# Patient Record
Sex: Male | Born: 1960 | Race: White | Hispanic: No | Marital: Married | Smoking: Never smoker
Health system: Southern US, Community
[De-identification: ages and names within clinical notes are randomized; demographics above are authoritative.]

## PROBLEM LIST (undated history)

## (undated) DIAGNOSIS — Z87438 Personal history of other diseases of male genital organs: Secondary | ICD-10-CM

## (undated) DIAGNOSIS — R339 Retention of urine, unspecified: Secondary | ICD-10-CM

## (undated) DIAGNOSIS — E785 Hyperlipidemia, unspecified: Secondary | ICD-10-CM

## (undated) HISTORY — DX: Retention of urine, unspecified: R33.9

## (undated) HISTORY — PX: ANTERIOR FUSION CERVICAL SPINE: SUR626

---

## 2004-07-11 ENCOUNTER — Ambulatory Visit: Payer: Self-pay

## 2004-09-17 ENCOUNTER — Ambulatory Visit (HOSPITAL_COMMUNITY): Admission: RE | Admit: 2004-09-17 | Discharge: 2004-09-18 | Payer: Self-pay | Admitting: Neurosurgery

## 2006-08-28 ENCOUNTER — Emergency Department: Payer: Self-pay | Admitting: Emergency Medicine

## 2014-03-09 ENCOUNTER — Ambulatory Visit: Payer: Self-pay | Admitting: Unknown Physician Specialty

## 2014-03-12 LAB — PATHOLOGY REPORT

## 2015-02-20 ENCOUNTER — Ambulatory Visit
Admission: RE | Admit: 2015-02-20 | Discharge: 2015-02-20 | Disposition: A | Discharge status: Home / Self Care | Payer: 59 | Source: Ambulatory Visit | Attending: Emergency Medicine | Admitting: Emergency Medicine

## 2015-02-20 ENCOUNTER — Observation Stay
Admission: EM | Admit: 2015-02-20 | Discharge: 2015-02-22 | Disposition: A | Discharge status: Home / Self Care | Payer: 59 | Attending: Surgery | Admitting: Surgery

## 2015-02-20 ENCOUNTER — Encounter
Admission: EM | Disposition: A | Discharge status: Home / Self Care | Payer: Self-pay | Source: Home / Self Care | Attending: Emergency Medicine

## 2015-02-20 ENCOUNTER — Observation Stay: Payer: 59 | Admitting: Anesthesiology

## 2015-02-20 ENCOUNTER — Other Ambulatory Visit: Payer: Self-pay | Admitting: Emergency Medicine

## 2015-02-20 ENCOUNTER — Encounter: Payer: Self-pay | Admitting: Emergency Medicine

## 2015-02-20 DIAGNOSIS — K3580 Unspecified acute appendicitis: Secondary | ICD-10-CM

## 2015-02-20 DIAGNOSIS — K3589 Other acute appendicitis: Secondary | ICD-10-CM | POA: Diagnosis not present

## 2015-02-20 DIAGNOSIS — K37 Unspecified appendicitis: Secondary | ICD-10-CM | POA: Diagnosis present

## 2015-02-20 DIAGNOSIS — K353 Acute appendicitis with localized peritonitis, without perforation or gangrene: Secondary | ICD-10-CM | POA: Insufficient documentation

## 2015-02-20 DIAGNOSIS — Z981 Arthrodesis status: Secondary | ICD-10-CM | POA: Insufficient documentation

## 2015-02-20 DIAGNOSIS — R1031 Right lower quadrant pain: Secondary | ICD-10-CM

## 2015-02-20 DIAGNOSIS — D72829 Elevated white blood cell count, unspecified: Secondary | ICD-10-CM | POA: Diagnosis not present

## 2015-02-20 HISTORY — PX: LAPAROSCOPIC APPENDECTOMY: SHX408

## 2015-02-20 LAB — URINALYSIS COMPLETE WITH MICROSCOPIC (ARMC ONLY)
Bilirubin Urine: NEGATIVE
Glucose, UA: NEGATIVE mg/dL
Hgb urine dipstick: NEGATIVE
Leukocytes, UA: NEGATIVE
Nitrite: NEGATIVE
PROTEIN: NEGATIVE mg/dL
Specific Gravity, Urine: 1.023 (ref 1.005–1.030)
Squamous Epithelial / LPF: NONE SEEN
pH: 7 (ref 5.0–8.0)

## 2015-02-20 LAB — CBC
HCT: 46.2 % (ref 40.0–52.0)
HEMOGLOBIN: 14.9 g/dL (ref 13.0–18.0)
MCH: 27.2 pg (ref 26.0–34.0)
MCHC: 32.4 g/dL (ref 32.0–36.0)
MCV: 84.1 fL (ref 80.0–100.0)
Platelets: 279 10*3/uL (ref 150–440)
RBC: 5.49 MIL/uL (ref 4.40–5.90)
RDW: 12.9 % (ref 11.5–14.5)
WBC: 27.6 10*3/uL — ABNORMAL HIGH (ref 3.8–10.6)

## 2015-02-20 LAB — LIPASE, BLOOD: Lipase: 20 U/L — ABNORMAL LOW (ref 22–51)

## 2015-02-20 LAB — COMPREHENSIVE METABOLIC PANEL
ALBUMIN: 4.5 g/dL (ref 3.5–5.0)
ALK PHOS: 72 U/L (ref 38–126)
ALT: 20 U/L (ref 17–63)
ANION GAP: 8 (ref 5–15)
AST: 24 U/L (ref 15–41)
BUN: 13 mg/dL (ref 6–20)
CALCIUM: 9.3 mg/dL (ref 8.9–10.3)
CO2: 26 mmol/L (ref 22–32)
CREATININE: 0.91 mg/dL (ref 0.61–1.24)
Chloride: 100 mmol/L — ABNORMAL LOW (ref 101–111)
GFR calc Af Amer: >60 mL/min (ref >60)
GFR calc non Af Amer: >60 mL/min (ref >60)
GLUCOSE: 130 mg/dL — AB (ref 65–99)
Potassium: 4.2 mmol/L (ref 3.5–5.1)
SODIUM: 134 mmol/L — AB (ref 135–145)
Total Bilirubin: 1.7 mg/dL — ABNORMAL HIGH (ref 0.3–1.2)
Total Protein: 7.8 g/dL (ref 6.5–8.1)

## 2015-02-20 SURGERY — APPENDECTOMY, LAPAROSCOPIC
Anesthesia: General

## 2015-02-20 MED ORDER — OXYCODONE HCL 5 MG PO TABS
5.0000 mg | ORAL_TABLET | ORAL | Status: DC | PRN
  Administered 2015-02-21 – 2015-02-22 (×3): 5 mg via ORAL
  Filled 2015-02-20: qty 2
  Filled 2015-02-20 (×2): qty 1

## 2015-02-20 MED ORDER — NEOSTIGMINE METHYLSULFATE 10 MG/10ML IV SOLN
INTRAVENOUS | Status: DC | PRN
  Administered 2015-02-20: 1 mg via INTRAVENOUS
  Administered 2015-02-20: 3 mg via INTRAVENOUS

## 2015-02-20 MED ORDER — PROPOFOL 10 MG/ML IV BOLUS
INTRAVENOUS | Status: DC | PRN
  Administered 2015-02-20: 150 mg via INTRAVENOUS

## 2015-02-20 MED ORDER — ACETAMINOPHEN 650 MG RE SUPP: 650.0000 mg | Freq: Four times a day (QID) | RECTAL | Status: DC | PRN

## 2015-02-20 MED ORDER — MORPHINE SULFATE (PF) 4 MG/ML IV SOLN
4.0000 mg | Freq: Once | INTRAVENOUS | Status: DC
  Administered 2015-02-20: 4 mg via INTRAVENOUS
  Filled 2015-02-20: qty 1

## 2015-02-20 MED ORDER — BUPIVACAINE HCL (PF) 0.25 % IJ SOLN
INTRAMUSCULAR | Status: DC | PRN
  Administered 2015-02-20: 30 mL

## 2015-02-20 MED ORDER — HYDROMORPHONE HCL 1 MG/ML IJ SOLN: 1.0000 mg | INTRAMUSCULAR | Status: DC | PRN

## 2015-02-20 MED ORDER — ACETAMINOPHEN 325 MG PO TABS: 650.0000 mg | ORAL_TABLET | Freq: Four times a day (QID) | ORAL | Status: DC | PRN

## 2015-02-20 MED ORDER — FENTANYL CITRATE (PF) 100 MCG/2ML IJ SOLN
INTRAMUSCULAR | Status: DC | PRN
  Administered 2015-02-20 (×2): 50 ug via INTRAVENOUS
  Administered 2015-02-20: 100 ug via INTRAVENOUS
  Administered 2015-02-20: 50 ug via INTRAVENOUS

## 2015-02-20 MED ORDER — LIDOCAINE HCL (CARDIAC) 20 MG/ML IV SOLN
INTRAVENOUS | Status: DC | PRN
  Administered 2015-02-20: 60 mg via INTRAVENOUS

## 2015-02-20 MED ORDER — KETOROLAC TROMETHAMINE 30 MG/ML IJ SOLN
INTRAMUSCULAR | Status: DC | PRN
  Administered 2015-02-20: 30 mg via INTRAVENOUS

## 2015-02-20 MED ORDER — FINASTERIDE 5 MG PO TABS: 5.0000 mg | ORAL_TABLET | Freq: Every day | ORAL | Status: DC

## 2015-02-20 MED ORDER — IOHEXOL 350 MG/ML SOLN
100.0000 mL | Freq: Once | INTRAVENOUS | Status: AC | PRN
  Administered 2015-02-20: 100 mL via INTRAVENOUS

## 2015-02-20 MED ORDER — ONDANSETRON HCL 4 MG/2ML IJ SOLN
INTRAMUSCULAR | Status: DC | PRN
  Administered 2015-02-20: 4 mg via INTRAVENOUS

## 2015-02-20 MED ORDER — ONDANSETRON HCL 4 MG/2ML IJ SOLN: 4.0000 mg | Freq: Four times a day (QID) | INTRAMUSCULAR | Status: DC | PRN

## 2015-02-20 MED ORDER — FENTANYL CITRATE (PF) 100 MCG/2ML IJ SOLN
25.0000 ug | INTRAMUSCULAR | Status: DC | PRN
  Administered 2015-02-20 (×4): 25 ug via INTRAVENOUS

## 2015-02-20 MED ORDER — LACTATED RINGERS IV SOLN
INTRAVENOUS | Status: DC | PRN
  Administered 2015-02-20: 16:00:00 via INTRAVENOUS

## 2015-02-20 MED ORDER — ROCURONIUM BROMIDE 100 MG/10ML IV SOLN
INTRAVENOUS | Status: DC | PRN
  Administered 2015-02-20: 10 mg via INTRAVENOUS
  Administered 2015-02-20: 25 mg via INTRAVENOUS
  Administered 2015-02-20: 5 mg via INTRAVENOUS

## 2015-02-20 MED ORDER — FENTANYL CITRATE (PF) 100 MCG/2ML IJ SOLN
INTRAMUSCULAR | Status: AC
  Administered 2015-02-20: 25 ug via INTRAVENOUS
  Filled 2015-02-20: qty 2

## 2015-02-20 MED ORDER — SODIUM CHLORIDE 0.9 % IV SOLN
INTRAVENOUS | Status: DC | PRN
  Administered 2015-02-20: 17:00:00 via INTRAMUSCULAR

## 2015-02-20 MED ORDER — KCL IN DEXTROSE-NACL 20-5-0.45 MEQ/L-%-% IV SOLN
INTRAVENOUS | Status: DC
  Administered 2015-02-20 (×2): via INTRAVENOUS
  Filled 2015-02-20 (×5): qty 1000

## 2015-02-20 MED ORDER — PANTOPRAZOLE SODIUM 40 MG IV SOLR: 40.0000 mg | Freq: Every day | INTRAVENOUS | Status: DC

## 2015-02-20 MED ORDER — ONDANSETRON HCL 4 MG/2ML IJ SOLN
4.0000 mg | Freq: Once | INTRAMUSCULAR | Status: DC
  Administered 2015-02-20: 4 mg via INTRAVENOUS
  Filled 2015-02-20: qty 2

## 2015-02-20 MED ORDER — ONDANSETRON 4 MG PO TBDP: 4.0000 mg | ORAL_TABLET | Freq: Four times a day (QID) | ORAL | Status: DC | PRN

## 2015-02-20 MED ORDER — SODIUM CHLORIDE 0.9 % IV BOLUS (SEPSIS)
1000.0000 mL | Freq: Once | INTRAVENOUS | Status: DC
  Administered 2015-02-20: 1000 mL via INTRAVENOUS

## 2015-02-20 MED ORDER — ONDANSETRON HCL 4 MG/2ML IJ SOLN: 4.0000 mg | Freq: Once | INTRAMUSCULAR | Status: DC | PRN

## 2015-02-20 MED ORDER — MIDAZOLAM HCL 5 MG/5ML IJ SOLN
INTRAMUSCULAR | Status: DC | PRN
  Administered 2015-02-20: 2 mg via INTRAVENOUS

## 2015-02-20 MED ORDER — ENOXAPARIN SODIUM 40 MG/0.4ML ~~LOC~~ SOLN
40.0000 mg | SUBCUTANEOUS | Status: DC
  Administered 2015-02-21: 40 mg via SUBCUTANEOUS
  Filled 2015-02-20: qty 0.4

## 2015-02-20 MED ORDER — DEXAMETHASONE SODIUM PHOSPHATE 10 MG/ML IJ SOLN
INTRAMUSCULAR | Status: DC | PRN
  Administered 2015-02-20: 10 mg via INTRAVENOUS

## 2015-02-20 MED ORDER — PIPERACILLIN-TAZOBACTAM 3.375 G IVPB
INTRAVENOUS | Status: AC
  Filled 2015-02-20: qty 50

## 2015-02-20 MED ORDER — SODIUM CHLORIDE 0.9 % IR SOLN
Status: DC | PRN
  Administered 2015-02-20: 1000 mL

## 2015-02-20 MED ORDER — SUCCINYLCHOLINE CHLORIDE 20 MG/ML IJ SOLN
INTRAMUSCULAR | Status: DC | PRN
  Administered 2015-02-20: 120 mg via INTRAVENOUS

## 2015-02-20 MED ORDER — PIPERACILLIN-TAZOBACTAM 3.375 G IVPB 30 MIN
3.3750 g | Freq: Once | INTRAVENOUS | Status: AC
  Administered 2015-02-20: 3.375 g via INTRAVENOUS

## 2015-02-20 MED ORDER — GLYCOPYRROLATE 0.2 MG/ML IJ SOLN
INTRAMUSCULAR | Status: DC | PRN
  Administered 2015-02-20: .5 mg via INTRAVENOUS
  Administered 2015-02-20: .1 mg via INTRAVENOUS

## 2015-02-20 MED ORDER — PIPERACILLIN-TAZOBACTAM 3.375 G IVPB
3.3750 g | Freq: Three times a day (TID) | INTRAVENOUS | Status: DC
  Administered 2015-02-20 – 2015-02-22 (×5): 3.375 g via INTRAVENOUS
  Filled 2015-02-20 (×9): qty 50

## 2015-02-20 SURGICAL SUPPLY — 40 items
BLADE CLIPPER SURG (BLADE) ×2 IMPLANT
CANISTER SUCT 1200ML W/VALVE (MISCELLANEOUS) ×2 IMPLANT
CANISTER SUCT 3000ML (MISCELLANEOUS) ×3 IMPLANT
CHLORAPREP W/TINT 26ML (MISCELLANEOUS) ×3 IMPLANT
CUTTER LINEAR ENDO 35 ART THIN (STAPLE) ×3 IMPLANT
DECANTER SPIKE VIAL GLASS SM (MISCELLANEOUS) ×2 IMPLANT
DRSG TEGADERM 2-3/8X2-3/4 SM (GAUZE/BANDAGES/DRESSINGS) ×9 IMPLANT
DRSG TELFA 3X8 NADH (GAUZE/BANDAGES/DRESSINGS) ×3 IMPLANT
GLOVE BIO SURGEON STRL SZ7.5 (GLOVE) ×7 IMPLANT
GLOVE INDICATOR 8.0 STRL GRN (GLOVE) ×3 IMPLANT
GOWN STRL REUS W/ TWL LRG LVL3 (GOWN DISPOSABLE) ×2 IMPLANT
GOWN STRL REUS W/TWL LRG LVL3 (GOWN DISPOSABLE) ×6
GRASPER SUT TROCAR 14GX15 (MISCELLANEOUS) ×3 IMPLANT
IRRIGATION STRYKERFLOW (MISCELLANEOUS) ×1 IMPLANT
IRRIGATOR STRYKERFLOW (MISCELLANEOUS) ×3
IV NS 1000ML (IV SOLUTION) ×6
IV NS 1000ML BAXH (IV SOLUTION) ×2 IMPLANT
KIT RM TURNOVER STRD PROC AR (KITS) ×3 IMPLANT
NDL FILTER BLUNT 18X1 1/2 (NEEDLE) ×1 IMPLANT
NDL HYPO 25X1 1.5 SAFETY (NEEDLE) ×1 IMPLANT
NDL INSUFFLATION 14GA 120MM (NEEDLE) ×1 IMPLANT
NEEDLE FILTER BLUNT 18X 1/2SAF (NEEDLE) ×2
NEEDLE FILTER BLUNT 18X1 1/2 (NEEDLE) ×1 IMPLANT
NEEDLE HYPO 25X1 1.5 SAFETY (NEEDLE) ×3 IMPLANT
NEEDLE INSUFFLATION 14GA 120MM (NEEDLE) ×3 IMPLANT
NS IRRIG 500ML POUR BTL (IV SOLUTION) ×3 IMPLANT
PACK LAP CHOLECYSTECTOMY (MISCELLANEOUS) ×3 IMPLANT
PAD DRESSING TELFA 3X8 NADH (GAUZE/BANDAGES/DRESSINGS) ×1 IMPLANT
PAD GROUND ADULT SPLIT (MISCELLANEOUS) ×3 IMPLANT
POUCH ENDO CATCH 10MM SPEC (MISCELLANEOUS) ×3 IMPLANT
RELOAD CUTTER ETS 35MM STAND (ENDOMECHANICALS) ×11 IMPLANT
SCISSORS METZENBAUM CVD 33 (INSTRUMENTS) ×3 IMPLANT
SUT ETHILON 5-0 FS-2 18 BLK (SUTURE) ×6 IMPLANT
SUT VIC AB 0 CT2 27 (SUTURE) ×3 IMPLANT
SYRINGE 10CC LL (SYRINGE) ×3 IMPLANT
TROCAR XCEL 12X100 BLDLESS (ENDOMECHANICALS) ×5 IMPLANT
TROCAR Z-THREAD FIOS 11X100 BL (TROCAR) ×3 IMPLANT
TROCAR Z-THREAD OPTICAL 5X100M (TROCAR) ×2 IMPLANT
TROCAR Z-THREAD SLEEVE 11X100 (TROCAR) ×3 IMPLANT
TUBING INSUFFLATOR HI FLOW (MISCELLANEOUS) ×3 IMPLANT

## 2015-02-20 NOTE — ED Notes (Signed)
Patient to ED from CT due to acute appendicitis. Patient reports RLQ abdominal pain for the last 24 hours.

## 2015-02-20 NOTE — Transfer of Care (Signed)
  Immediate Anesthesia Transfer of Care Note  Patient: Eric Anderson  Procedure(s) Performed: Procedure(s): APPENDECTOMY LAPAROSCOPIC (N/A)  Patient Location: PACU  Anesthesia Type:General  Level of Consciousness: awake and patient cooperative  Airway & Oxygen Therapy: Patient Spontanous Breathing and Patient connected to face mask oxygen  Post-op Assessment: Report given to RN and Post -op Vital signs reviewed and stable  Post vital signs: Reviewed and stable  Last Vitals:  Filed Vitals:   02/20/15 1807  BP: 139/68  Pulse: 81  Temp: 37.7 C  Resp: 10    Complications: No apparent anesthesia complications

## 2015-02-20 NOTE — ED Notes (Signed)
Patients keeps in his possession: watch, glasses, cellphone, shirt, shorts, shoes, and wallet

## 2015-02-20 NOTE — Anesthesia Preprocedure Evaluation (Addendum)
Anesthesia Evaluation  Patient identified by MRN, date of birth, ID band Patient awake    Reviewed: Allergy & Precautions, NPO status , Patient's Chart, lab work & pertinent test results  Airway Mallampati: II  TM Distance: <3 FB Neck ROM: Full    Dental  (+) Chipped   Pulmonary neg pulmonary ROS,    Pulmonary exam normal breath sounds clear to auscultation       Cardiovascular negative cardio ROS Normal cardiovascular exam     Neuro/Psych negative neurological ROS  negative psych ROS   GI/Hepatic negative GI ROS, Neg liver ROS,   Endo/Other  negative endocrine ROS  Renal/GU negative Renal ROS  negative genitourinary   Musculoskeletal negative musculoskeletal ROS (+)   Abdominal Normal abdominal exam  (+)   Peds negative pediatric ROS (+)  Hematology negative hematology ROS (+)   Anesthesia Other Findings   Reproductive/Obstetrics                            Anesthesia Physical Anesthesia Plan  ASA: II  Anesthesia Plan: General   Post-op Pain Management:    Induction: Intravenous  Airway Management Planned: Oral ETT  Additional Equipment:   Intra-op Plan:   Post-operative Plan: Extubation in OR  Informed Consent: I have reviewed the patients History and Physical, chart, labs and discussed the procedure including the risks, benefits and alternatives for the proposed anesthesia with the patient or authorized representative who has indicated his/her understanding and acceptance.   Dental advisory given  Plan Discussed with: CRNA and Surgeon  Anesthesia Plan Comments:         Anesthesia Quick Evaluation

## 2015-02-20 NOTE — Anesthesia Procedure Notes (Signed)
Procedure Name: Intubation Date/Time: 02/20/2015 4:45 PM Performed by: Lily Kocher Pre-anesthesia Checklist: Patient identified, Patient being monitored, Timeout performed, Emergency Drugs available and Suction available Patient Re-evaluated:Patient Re-evaluated prior to inductionOxygen Delivery Method: Circle system utilized Preoxygenation: Pre-oxygenation with 100% oxygen Intubation Type: IV induction Ventilation: Mask ventilation without difficulty Laryngoscope Size: Mac and 4 Grade View: Grade II Tube type: Oral Tube size: 7.5 mm Number of attempts: 1 (pt anterior) Airway Equipment and Method: Stylet Placement Confirmation: ETT inserted through vocal cords under direct vision,  positive ETCO2 and breath sounds checked- equal and bilateral Secured at: 23 cm Dental Injury: Teeth and Oropharynx as per pre-operative assessment

## 2015-02-20 NOTE — H&P (Signed)
Eric Anderson is a 54 y.o. male  24 hours of abdominal pain.  HPI: He was in his usual state of good health until yesterday approximately lunchtime when he began to develop some vague generalized abdominal discomfort and malaise. It progressed over the course of the evening to significant abdominal pain and he presented to his primary care physician for further evaluation. He was sent to the hospital for CT scan which demonstrated what appeared to be acute appendicitis with inflammatory stranding around the appendix. No other abnormality was identified.  A month ago the patient has similar episode which resolved over 24 hours. He describes the pain is almost identical to the current situation but did not progress. He's been nauseated and vomited once during this evaluation. He denies any other significant abdominal problems. Specifically he has no history of hepatitis yellow jaundice pancreatitis peptic ulcer disease gallbladder disease or diverticulitis. He's had no previous abdominal surgery. He does have a history of cervical fusion. He had a colonoscopy several years ago which demonstrated multiple polyps but no other evidence for significant colonic lesion. His medical history is otherwise unremarkable.  History reviewed. No pertinent past medical history. History reviewed. No pertinent past surgical history. Social History   Social History  . Marital Status: Married    Spouse Name: N/A  . Number of Children: N/A  . Years of Education: N/A   Social History Main Topics  . Smoking status: Never Smoker   . Smokeless tobacco: None  . Alcohol Use: Yes     Comment: occasional  . Drug Use: None  . Sexual Activity: Not Asked   Other Topics Concern  . None   Social History Narrative     Review of Systems  Constitutional: Negative.   HENT: Negative.   Eyes: Negative.   Respiratory: Negative.   Cardiovascular: Negative.   Gastrointestinal: Positive for nausea, vomiting and  abdominal pain. Negative for heartburn, diarrhea and constipation.  Genitourinary: Negative.   Musculoskeletal: Negative.   Skin: Negative.   Neurological: Negative.   Psychiatric/Behavioral: Negative.      PHYSICAL EXAM: BP 124/81 mmHg  Pulse 94  Temp(Src) 99 F (37.2 C) (Oral)  Resp 12  Ht 5\' 8"  (1.727 m)  Wt 85.276 kg (188 lb)  BMI 28.59 kg/m2  SpO2 96%  Physical Exam  Constitutional: He is oriented to person, place, and time. He appears well-developed and well-nourished.  HENT:  Head: Normocephalic and atraumatic.  Eyes: EOM are normal. Pupils are equal, round, and reactive to light.  Neck: Normal range of motion. Neck supple.  Cardiovascular: Regular rhythm and normal heart sounds.   Pulmonary/Chest: Effort normal and breath sounds normal.  Abdominal: Soft. Bowel sounds are normal. He exhibits no distension. There is tenderness. There is rebound and guarding.  Musculoskeletal: Normal range of motion. He exhibits no edema or tenderness.  Neurological: He is alert and oriented to person, place, and time.  Skin: Skin is warm and dry.  Psychiatric: His behavior is normal. Judgment normal.   His abdomen is markedly tender primarily in right lower quadrant suprapubic area. He is guarding in that area with moderate rebound. He has referred rebound to the right lower quadrant.  Impression/Plan: I independently reviewed his CT scan. His white blood cell count is 27,000. In this setting we would be concerned about possibility of appendicitis possibly even a ruptured appendicitis. We discussed risks benefits and options of surgical intervention. We also discussed primary antibiotic therapy. In this setting with a markedly elevated  white blood cell count CT evidence for appendicitis otherwise healthy individual I would recommend surgery and he is in agreement. We will plan for surgical intervention citizen operating room can be arranged.   Tiney Rouge III, MD  02/20/2015, 1:16 PM

## 2015-02-20 NOTE — Op Note (Signed)
02/20/2015  5:58 PM  PATIENT:  Eric Anderson  54 y.o. male  PRE-OPERATIVE DIAGNOSIS:  appendicitis  POST-OPERATIVE DIAGNOSIS:  appendicitis  PROCEDURE:  Procedure(s): APPENDECTOMY LAPAROSCOPIC (N/A)  SURGEON:  Surgeon(s) and Role:    * Tiney Rouge III, MD - Primary   ASSISTANTS: none   ANESTHESIA:   general  EBL:  Total I/O In: 600 [I.V.:600] Out: 100 [Blood:100]   DRAINS: none   LOCAL MEDICATIONS USED:  BUPIVICAINE    DISPOSITION OF SPECIMEN:  PATHOLOGY   DICTATION: .Dragon Dictation with the patient supine position after induction of appropriate general anesthesia patient abdomen was prepped ChloraPrep and draped sterile towels. The patient was placed in headdown feet up position. Small infraumbilical incision was made in standard fashion carried down bluntly to subcutaneous tissue. A Varess needle was used to Cannulate peritoneal cavity. CO2 was insufflated to appropriate pressure measurements. When approximately 2 and half liters of CO2 were instilled a Varess needle was withdrawn and an 11 mm port placed under direct vision. Intraperitoneal position was confirmed and CO2 was reinsufflated. The patient was rolled slightly to the left side. A midepigastric transverse incision was made and a level millimeter port inserted under direct vision. There was significant amount of purulent fluid in the right lower quadrant. The appendix was foreshortened as was the mesentery and appeared to be markedly inflamed. I did not see any evidence of rupture.  Suprapubic transverse incision was made and a 12 mm port inserted under direct vision. The camera was moved to the upper port and dissection was carried up to the 2 lower ports. The patient appendix was identified a window created in the mesial appendix and the base divided using a single application of the Endo GIA stapler carrying a blue load. The mesoappendix was then divided with multiple applications of the Endo GIA stapler carrying  a white load. Appendix was captured in an Endo Catch apparatus removed through the suprapubic incision. The area was then clips irrigated with warm saline solution. A lower midline incision was closed with figure-of-eight suture of 0 Vicryl using the suture passer. The abdomen was then desufflated. Skin incisions were closed with 5-0 nylon. The area was infiltrated with 0.25% Marcaine for postoperative pain control. Wounds were dressed sterilely and the patient returned recovery room having tolerated the procedure well. Sponge instrument needle count were correct 2 in the operative.  PLAN OF CARE: Admit to inpatient   PATIENT DISPOSITION:  PACU - hemodynamically stable.   Tiney Rouge III, MD

## 2015-02-20 NOTE — Op Note (Deleted)
02/20/2015  2:42 PM  PATIENT:  Eric Anderson  54 y.o. male  PRE-OPERATIVE DIAGNOSIS:  side  POST-OPERATIVE DIAGNOSIS:  * No post-op diagnosis entered *  PROCEDURE:  Procedure(s): APPENDECTOMY LAPAROSCOPIC (N/A)  SURGEON:  Surgeon(s) and Role:    * Tiney Rouge III, MD - Primary   ASSISTANTS: none   ANESTHESIA:   general  EBL:      DRAINS: (1) Jackson-Pratt drain(s) with closed bulb suction in the Gallbladder fossa   LOCAL MEDICATIONS USED:  BUPIVICAINE    DISPOSITION OF SPECIMEN:  PATHOLOGY   DICTATION: .Dragon Dictation with the patient in the supine position infection appropriate general anesthesia the patient's abdomen was prepped with ChloraPrep and draped sterile towels. Patient's place the headdown feet up position. Small infraumbilical incision was made standard fashion carried down bluntly to subcutaneous tissue. There is needle was used to cannulate peritoneal cavity. CO2 was insufflated to appropriate pressure measurements. When approximately 2 L of CO2 were instilled a varies needle was withdrawn and an 11 mm port inserted in the peritoneal cavity. Into the position was confirmed and CO2 was reinsufflated.  The patient was placed head up feet down position rotated slightly to the left side. Subxiphoid transverse incision was made 11 mm port placed under direct vision. 2 lateral ports 5 mm in size were placed under direct vision. 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 It was aspirated approximately 50 cc of bile. The gallbladder was then retracted superiorly and laterally exposing the hepatoduodenal ligament. Cystic artery and cystic duct were identified. Both were clipped and divided. The gallbladder was then dissected free from its bed in the liver using cautery apparatus. Once it was free was captured in an Endo Catch apparatus removed through the subxiphoid incision. The area was then copiously irrigated with warm saline solution  A  19 [place] Blake drain was inserted through the epigastric incision brought out through a lateral stab wound. It was secured with 2-0 nylon. Was placed in the bed of the liver. The upper midline incision was closed with figure-of-eight suture of 0 Vicryl using the suture passer. The abdomen was then desufflated and all ports withdrawn without difficulty Skin incisions were all closed with 5-0 nylon. The area was infiltrated with 0.25% Marcaine for postoperative pain control. Sterile dressings were applied. The patient was then returned recovery room having tolerated the procedure well. Sponge instrument needle count were correct 2 in the operating room. PLAN OF CARE: Admit for overnight observation  PATIENT DISPOSITION:  PACU - hemodynamically stable.   Tiney Rouge III, MD

## 2015-02-20 NOTE — ED Provider Notes (Signed)
[facility] Emergency Department Provider Note  Time seen: 1:10 PM  I have reviewed the triage vital signs and the nursing notes.   HISTORY  Chief Complaint Abdominal Pain    HPI Eric Anderson is a 54 y.o. male with no past medical history who presents the emergency department with lower abdominal pain since yesterday. According to the patient he developed lower abdominal pain and some nausea yesterday. It worsened overnight and into this morning. He went to an urgent care who sent him to the emergency department for CT scan. Following his CT scan he was taken directly to the emergency department due to positive acute appendicitis on CT scan. Patient describes as lower abdominal pain as a 6/10 when lying still which increases to a 10/10 with any movement. Feels nauseated but denies any vomiting. Denies any fever.     History reviewed. No pertinent past medical history.  There are no active problems to display for this patient.   History reviewed. No pertinent past surgical history.  No current outpatient prescriptions on file.  Allergies Review of patient's allergies indicates no known allergies.  History reviewed. No pertinent family history.  Social History Social History  Substance Use Topics  . Smoking status: Never Smoker   . Smokeless tobacco: None  . Alcohol Use: Yes     Comment: occasional    Review of Systems Constitutional: Negative for fever. Cardiovascular: Negative for chest pain. Respiratory: Negative for shortness of breath. Gastrointestinal: As it for lower abdominal pain, right greater than left. Genitourinary: Negative for dysuria. Musculoskeletal: Negative for back pain. 10-point ROS otherwise negative.  ____________________________________________   PHYSICAL EXAM:  VITAL SIGNS: ED Triage Vitals  Enc Vitals Group     BP 02/20/15 1229 132/77 mmHg     Pulse Rate 02/20/15 1229 95     Resp 02/20/15 1229 18   Temp 02/20/15 1229 99 F (37.2 C)     Temp Source 02/20/15 1229 Oral     SpO2 02/20/15 1229 100 %     Weight 02/20/15 1229 188 lb (85.276 kg)     Height 02/20/15 1229  (1.727 m)     Head Cir --      Peak Flow --      Pain Score 02/20/15 1229 5     Pain Loc --      Pain Edu? --      Excl. in GC? --     Constitutional: Alert and oriented. Well appearing and in no distress. Eyes: Normal exam ENT   Mouth/Throat: Mucous membranes are moist. Cardiovascular: Normal rate, regular rhythm.  Respiratory: Normal respiratory effort without tachypnea nor retractions. Breath sounds are clear  Gastrointestinal: Soft, moderate lower abdominal tenderness, more so in the right lower quadrant and left lower quadrant. Mild voluntary guarding in the right lower quadrant, no rebound. No signs of generalized peritonitis. Musculoskeletal: Nontender with normal range of motion in all extremities.  Neurologic:  Normal speech and language. No gross focal neurologic deficits  Psychiatric: Mood and affect are normal. Speech and behavior are normal. ____________________________________________     RADIOLOGY  Acute appendicitis  ____________________________________________   INITIAL IMPRESSION / ASSESSMENT AND PLAN / ED COURSE  Pertinent labs & imaging results that were available during my care of the patient were reviewed by me and considered in my medical decision making (see chart for details).  Patient presents the emergency department with lower abdominal pain and a CT scan consistent with acute appendicitis. I discussed  the patient with Dr. Michela Pitcher, who will be down to see the patient. Patient currently describes his pain as a 5/10, worsened to a 10/10 with any movement. Patient does have some voluntary guarding in the right lower quadrant, no signs of generalized peritonitis. Patient has no chronic medical problems, denies any allergies. He has had a prior cervical fusion without any adverse  anesthesia reaction.  White blood cell count of 27, labs otherwise within normal limits. Patient taken to the operating room by Dr. Michela Pitcher.  ____________________________________________   FINAL CLINICAL IMPRESSION(S) / ED DIAGNOSES  Acute appendicitis   Minna Antis, MD 02/20/15 2059

## 2015-02-21 ENCOUNTER — Encounter: Payer: Self-pay | Admitting: Surgery

## 2015-02-21 LAB — COMPREHENSIVE METABOLIC PANEL
ALK PHOS: 59 U/L (ref 38–126)
ALT: 16 U/L — AB (ref 17–63)
ANION GAP: 7 (ref 5–15)
AST: 22 U/L (ref 15–41)
Albumin: 3.4 g/dL — ABNORMAL LOW (ref 3.5–5.0)
BUN: 14 mg/dL (ref 6–20)
CHLORIDE: 105 mmol/L (ref 101–111)
CO2: 25 mmol/L (ref 22–32)
Calcium: 8.2 mg/dL — ABNORMAL LOW (ref 8.9–10.3)
Creatinine, Ser: 0.84 mg/dL (ref 0.61–1.24)
GLUCOSE: 162 mg/dL — AB (ref 65–99)
POTASSIUM: 4 mmol/L (ref 3.5–5.1)
Sodium: 137 mmol/L (ref 135–145)
Total Bilirubin: 1.2 mg/dL (ref 0.3–1.2)
Total Protein: 6.6 g/dL (ref 6.5–8.1)

## 2015-02-21 LAB — CBC
HEMATOCRIT: 37 % — AB (ref 40.0–52.0)
HEMOGLOBIN: 12.1 g/dL — AB (ref 13.0–18.0)
MCH: 27.5 pg (ref 26.0–34.0)
MCHC: 32.6 g/dL (ref 32.0–36.0)
MCV: 84.2 fL (ref 80.0–100.0)
Platelets: 245 10*3/uL (ref 150–440)
RBC: 4.4 MIL/uL (ref 4.40–5.90)
RDW: 12.8 % (ref 11.5–14.5)
WBC: 22.4 10*3/uL — ABNORMAL HIGH (ref 3.8–10.6)

## 2015-02-21 MED ORDER — KCL IN DEXTROSE-NACL 20-5-0.45 MEQ/L-%-% IV SOLN
INTRAVENOUS | Status: DC
  Administered 2015-02-21 – 2015-02-22 (×2): via INTRAVENOUS
  Filled 2015-02-21 (×4): qty 1000

## 2015-02-21 MED ORDER — KCL IN DEXTROSE-NACL 20-5-0.45 MEQ/L-%-% IV SOLN: INTRAVENOUS | Status: DC

## 2015-02-21 NOTE — Progress Notes (Signed)
Pt post lap appendectomy day 1. No c/o pain. No n/v noted.  Ambulated around nurses' station 6 laps without difficulty independently. Continues on IV antibiotics. Encouraged ICS use. Tolerating diet. Advanced diet to full liquid as ordered.  Will cont. To monitor.

## 2015-02-21 NOTE — Progress Notes (Signed)
1 Day Post-Op   He is improving. He has less abdominal discomfort. He does complain of some incisional pain. He had a low-grade fever overnight. He does not have any significant nausea.   Vital signs in last 24 hours: Temp:  [97.5 F (36.4 C)-100.1 F (37.8 C)] 98 F (36.7 C) (09/15 1022) Pulse Rate:  [56-123] 74 (09/15 1022) Resp:  [10-24] 16 (09/15 0542) BP: (100-146)/(59-81) 130/67 mmHg (09/15 1022) SpO2:  [94 %-100 %] 96 % (09/15 1022) Weight:  [85.276 kg (188 lb)] 85.276 kg (188 lb) (09/14 1229)    Intake/Output from previous day: 09/14 0701 - 09/15 0700 In: 1449 [I.V.:1418; IV Piggyback:31] Out: 1075 [Urine:975; Blood:100]  GI: His abdomen is soft with some abdominal wall tenderness mostly incisional. He does have some drainage on the dressings.  Lab Results:  CBC  Recent Labs  02/20/15 1236 02/21/15 0711  WBC 27.6* 22.4*  HGB 14.9 12.1*  HCT 46.2 37.0*  PLT 279 245   CMP     Component Value Date/Time   NA 137 02/21/2015 0711   K 4.0 02/21/2015 0711   CL 105 02/21/2015 0711   CO2 25 02/21/2015 0711   GLUCOSE 162* 02/21/2015 0711   BUN 14 02/21/2015 0711   CREATININE 0.84 02/21/2015 0711   CALCIUM 8.2* 02/21/2015 0711   PROT 6.6 02/21/2015 0711   ALBUMIN 3.4* 02/21/2015 0711   AST 22 02/21/2015 0711   ALT 16* 02/21/2015 0711   ALKPHOS 59 02/21/2015 0711   BILITOT 1.2 02/21/2015 0711   GFRNONAA >60 02/21/2015 0711   GFRAA >60 02/21/2015 0711   PT/INR No results for input(s): LABPROT, INR in the last 72 hours.  Studies/Results: Ct Abdomen Pelvis W Contrast  02/20/2015   CLINICAL DATA:  Right lower quadrant pain starting yesterday.  EXAM: CT ABDOMEN AND PELVIS WITH CONTRAST  TECHNIQUE: Multidetector CT imaging of the abdomen and pelvis was performed using the standard protocol following bolus administration of intravenous contrast.  CONTRAST:  OMNIPAQUE IOHEXOL 350 MG/ML SOLN  COMPARISON:  None.  FINDINGS: The appendix is enlarged with surrounding  inflammation and fluid. There is no free air. There is no small bowel obstruction or diverticulitis.  The liver, spleen, pancreas, gallbladder, adrenal glands and right kidney are normal. There are small cysts within the left kidney. There is no hydronephrosis bilaterally. There is minimal atherosclerosis of the aorta. There is no abdominal lymphadenopathy.  Partial fluid-filled bladder is normal. Prostate calcifications are noted. The lung bases are clear. No acute abnormalities identified in the visualized bones.  IMPRESSION: Acute appendicitis.  These results will be called to the ordering clinician or representative by the Radiologist Assistant, and communication documented in the PACS or zVision Dashboard.   Electronically Signed   By: Sherian Rein M.D.   On: 02/20/2015 12:08    Assessment/Plan: He is overall improved. We will continue our current plan on antibiotics since he has such significant contamination at surgery. I anticipate discharge tomorrow on by mouth pain medicines and oral antibiotics. He is in agreement.

## 2015-02-22 LAB — SURGICAL PATHOLOGY

## 2015-02-22 MED ORDER — HYDROCODONE-ACETAMINOPHEN 5-325 MG PO TABS: 1.0000 | ORAL_TABLET | Freq: Four times a day (QID) | ORAL | Status: DC | PRN

## 2015-02-22 MED ORDER — OXYCODONE HCL 5 MG PO TABS: 5.0000 mg | ORAL_TABLET | ORAL | Status: DC | PRN

## 2015-02-22 MED ORDER — METRONIDAZOLE 500 MG PO TABS: 500.0000 mg | ORAL_TABLET | Freq: Three times a day (TID) | ORAL | Status: DC

## 2015-02-22 MED ORDER — CIPROFLOXACIN HCL 500 MG PO TABS: 500.0000 mg | ORAL_TABLET | Freq: Two times a day (BID) | ORAL | Status: DC

## 2015-02-22 NOTE — Progress Notes (Signed)
A&O. VSS. Tolerating diet well. medicated for pain with relief noted. No nausea reported. Ambulating unassisted with steady gait. Dressings dry and intact. IV removed per policy. Discharged per MD orders.  Discharge instructions reviewed with pt and pt verbalized understanding. Prescriptions given to pt. Awaiting ride to arrive.

## 2015-02-22 NOTE — Discharge Instructions (Signed)
Laparoscopic Appendectomy  Care After    These instructions give you information on caring for yourself after your procedure. Your doctor may also give you more specific instructions. Call your doctor if you have any problems or questions after your procedure.  HOME CARE  Do not drive while taking pain medicine (narcotics).  Take medicine (stool softener) if you cannot poop (constipated).  Change your bandages (dressings) as told by your doctor.  Keep your wounds clean and dry. Wash the wounds gently with soap and water. Gently pat the wounds dry with a clean towel.  Do not take baths, swim, or use hot tubs for 10 days, or as told by your doctor.  Only take medicine as told by your doctor.  Continue your normal diet as told by your doctor.  Do not lift more than 10 pounds (4.5 kilograms) or play contact sports for 3 weeks, or as told by your doctor.  Slowly increase your activity.  Take deep breaths to avoid a lung infection (pneumonia). GET HELP RIGHT AWAY IF:  You have a fever >101 You have a rash.  You have trouble breathing or sharp chest pain.  You have a reaction to the medicine you are taking.  Your wound is red, puffy (swollen), or painful.  You have yellowish-white fluid (pus) coming from the wound.  You have fluid coming from the wound for longer than 1 day.  You notice a bad smell coming from the wound or bandage.  Your wound breaks open after stitches (sutures) or staples are removed.  You have pain in the shoulders or shoulder blades.   You are short of breath.  You feel sick to your stomach (nauseous) or throw up (vomit).  MAKE SURE YOU:  Understand these instructions.  Will watch your condition.  Will get help right away if you are not doing well or get worse.  Appendicitis Appendicitis is when the appendix is swollen (inflamed). The inflammation can lead to developing a hole (perforation) and a collection of pus (abscess). CAUSES  There is not always an obvious  cause of appendicitis. Sometimes it is caused by an obstruction in the appendix. The obstruction can be caused by: A small, hard, pea-sized ball of stool (fecalith). Enlarged lymph glands in the appendix. SYMPTOMS  Pain around your belly button (navel) that moves toward your lower right belly (abdomen). The pain can become more severe and sharp as time passes. Tenderness in the lower right abdomen. Pain gets worse if you cough or make a sudden movement. Feeling sick to your stomach (nauseous). Throwing up (vomiting). Loss of appetite. Fever. Constipation. Diarrhea. Generally not feeling well. DIAGNOSIS  Physical exam. Blood tests. Urine test. X-rays or a CT scan may confirm the diagnosis. TREATMENT  Once the diagnosis of appendicitis is made, the most common treatment is to remove the appendix as soon as possible. This procedure is called appendectomy. In an open appendectomy, a cut (incision) is made in the lower right abdomen and the appendix is removed. In a laparoscopic appendectomy, usually 3 small incisions are made. Long, thin instruments and a camera tube are used to remove the appendix. Most patients go home in 24 to 48 hours after appendectomy. In some situations, the appendix may have already perforated and an abscess may have formed. The abscess may have a "wall" around it as seen on a CT scan. In this case, a drain may be placed into the abscess to remove fluid, and you may be treated with antibiotic medicines  that kill germs. The medicine is given through a tube in your vein (IV). Once the abscess has resolved, it may or may not be necessary to have an appendectomy. You may need to stay in the hospital longer than 48 hours. Document Released: 05/25/2005 Document Revised: 11/24/2011 Document Reviewed: 08/20/2009 [facility] Patient Information 2015 [place], [place]. This information is not intended to replace advice given to you by your health care provider. Make sure you discuss any  questions you have with your health care provider.

## 2015-02-22 NOTE — Discharge Summary (Signed)
Patient ID: Eric Anderson MRN: [id number] DOB/AGE: 1960/09/09 54 y.o.  Admit date: 02/20/2015 Discharge date: 02/22/2015  Discharge Diagnoses:  Acute suppurative appendicitis  Procedures Performed: Laparoscopic appendectomy  Discharged Condition: good  Hospital Course: Patient was seen in emergency room with signs and symptoms consistent with acute appendicitis. He was taken urgently to surgery that evening where he underwent laparoscopic appendectomy. At surgery was noted to have significant inflammatory change or purulence without evidence of an obvious rupture. His slow return of bowel function and was treated with IV antibiotics for 48 hours. To be discharged home today on by mouth antibiotics with a plan to follow him up in 1 week's time.  Discharge Orders: Discharge Instructions    Diet - low sodium heart healthy    Complete by:  As directed      Increase activity slowly    Complete by:  As directed      No wound care    Complete by:  As directed            Disposition:   Discharge Medications:  Current facility-administered medications:  .  acetaminophen (TYLENOL) tablet 650 mg, 650 mg, Oral, Q6H PRN **OR** acetaminophen (TYLENOL) suppository 650 mg, 650 mg, Rectal, Q6H PRN, Tiney Rouge III, MD .  dextrose 5 % and 0.45 % NaCl with KCl 20 mEq/L infusion, , Intravenous, Continuous, Tiney Rouge III, MD, Last Rate: 75 mL/hr at 02/22/15 0315 .  enoxaparin (LOVENOX) injection 40 mg, 40 mg, Subcutaneous, Q24H, Tiney Rouge III, MD, 40 mg at 02/21/15 1358 .  HYDROmorphone (DILAUDID) injection 1 mg, 1 mg, Intravenous, Q2H PRN, Tiney Rouge III, MD .  ondansetron (ZOFRAN-ODT) disintegrating tablet 4 mg, 4 mg, Oral, Q6H PRN **OR** ondansetron (ZOFRAN) injection 4 mg, 4 mg, Intravenous, Q6H PRN, Tiney Rouge III, MD .  oxyCODONE (Oxy IR/ROXICODONE) immediate release tablet 5-10 mg, 5-10 mg, Oral, Q4H PRN, Tiney Rouge III, MD, 5 mg at 02/22/15 0739 .  piperacillin-tazobactam (ZOSYN) IVPB 3.375 g,  3.375 g, Intravenous, 3 times per day, Tiney Rouge III, MD, Last Rate: 12.5 mL/hr at 02/22/15 0510, 3.375 g at 02/22/15 0510  Follwup: Follow-up Information    Follow up with [facility] In 1 week.   Contact information:   [address] [place] [address] [phone]      Signed: Tiney Rouge III 02/22/2015, 11:51 AM

## 2015-02-24 ENCOUNTER — Emergency Department
Admission: EM | Admit: 2015-02-24 | Discharge: 2015-02-25 | Disposition: A | Discharge status: Home / Self Care | Payer: 59 | Attending: Emergency Medicine | Admitting: Emergency Medicine

## 2015-02-24 ENCOUNTER — Encounter: Payer: Self-pay | Admitting: Emergency Medicine

## 2015-02-24 DIAGNOSIS — Z792 Long term (current) use of antibiotics: Secondary | ICD-10-CM | POA: Diagnosis not present

## 2015-02-24 DIAGNOSIS — Z79899 Other long term (current) drug therapy: Secondary | ICD-10-CM | POA: Insufficient documentation

## 2015-02-24 DIAGNOSIS — R1031 Right lower quadrant pain: Secondary | ICD-10-CM | POA: Diagnosis not present

## 2015-02-24 DIAGNOSIS — M545 Low back pain: Secondary | ICD-10-CM | POA: Insufficient documentation

## 2015-02-24 DIAGNOSIS — R112 Nausea with vomiting, unspecified: Secondary | ICD-10-CM | POA: Diagnosis present

## 2015-02-24 DIAGNOSIS — G8918 Other acute postprocedural pain: Secondary | ICD-10-CM | POA: Diagnosis not present

## 2015-02-24 LAB — COMPREHENSIVE METABOLIC PANEL
ALT: 25 U/L (ref 17–63)
AST: 23 U/L (ref 15–41)
Albumin: 3.8 g/dL (ref 3.5–5.0)
Alkaline Phosphatase: 62 U/L (ref 38–126)
Anion gap: 10 (ref 5–15)
BILIRUBIN TOTAL: 0.9 mg/dL (ref 0.3–1.2)
BUN: 13 mg/dL (ref 6–20)
CHLORIDE: 102 mmol/L (ref 101–111)
CO2: 24 mmol/L (ref 22–32)
CREATININE: 0.89 mg/dL (ref 0.61–1.24)
Calcium: 8.9 mg/dL (ref 8.9–10.3)
Glucose, Bld: 135 mg/dL — ABNORMAL HIGH (ref 65–99)
POTASSIUM: 3.8 mmol/L (ref 3.5–5.1)
Sodium: 136 mmol/L (ref 135–145)
TOTAL PROTEIN: 7.4 g/dL (ref 6.5–8.1)

## 2015-02-24 LAB — CBC
HEMATOCRIT: 41.6 % (ref 40.0–52.0)
Hemoglobin: 14 g/dL (ref 13.0–18.0)
MCH: 28.1 pg (ref 26.0–34.0)
MCHC: 33.6 g/dL (ref 32.0–36.0)
MCV: 83.7 fL (ref 80.0–100.0)
PLATELETS: 334 10*3/uL (ref 150–440)
RBC: 4.97 MIL/uL (ref 4.40–5.90)
RDW: 12.8 % (ref 11.5–14.5)
WBC: 16.3 10*3/uL — AB (ref 3.8–10.6)

## 2015-02-24 LAB — URINALYSIS COMPLETE WITH MICROSCOPIC (ARMC ONLY)
BILIRUBIN URINE: NEGATIVE
GLUCOSE, UA: NEGATIVE mg/dL
HGB URINE DIPSTICK: NEGATIVE
NITRITE: NEGATIVE
PH: 5 (ref 5.0–8.0)
Protein, ur: NEGATIVE mg/dL
SPECIFIC GRAVITY, URINE: 1.027 (ref 1.005–1.030)

## 2015-02-24 MED ORDER — SODIUM CHLORIDE 0.9 % IV BOLUS (SEPSIS)
1000.0000 mL | Freq: Once | INTRAVENOUS | Status: AC
  Administered 2015-02-25: 1000 mL via INTRAVENOUS

## 2015-02-24 MED ORDER — ONDANSETRON HCL 4 MG/2ML IJ SOLN
INTRAMUSCULAR | Status: AC
  Filled 2015-02-24: qty 2

## 2015-02-24 MED ORDER — ONDANSETRON HCL 4 MG/2ML IJ SOLN
4.0000 mg | Freq: Once | INTRAMUSCULAR | Status: AC | PRN
  Administered 2015-02-24: 4 mg via INTRAVENOUS

## 2015-02-24 NOTE — ED Notes (Addendum)
Pt states is post appendectomy from 02/20/2015. Pt states "i feel like crap". Pt states has been nauseated, had vomiting and has had chills, abdominal pain. Skin normal color warm and dry. Pt with healing surgical wounds to abd.

## 2015-02-24 NOTE — ED Provider Notes (Signed)
[facility] Emergency Department Provider Note  ____________________________________________  Time seen: Approximately 11:41 PM  I have reviewed the triage vital signs and the nursing notes.   HISTORY  Chief Complaint Emesis and Abdominal Pain    HPI Eric Anderson is a 54 y.o. male who reports that he was here on Wednesday and had an appendectomy. The patient reports that he was doing well on Thursday and sent home Friday with 2 antibiotics and pain medication. The patient reports that today he's had multiple episodes of nausea and cold sweats. He reports that he did have someone some spaghetti with sauce and did well but he started having increased pain and some episodes of vomiting. The patient reports that he had some pain to his right lower quadrant and right flank and also noticed a bruise in his right lower quadrant. The patient reports he's continued to have nausea with some vomiting nonbilious nonbloody and was unsure of the cause. The patient reports that after his surgery was told he had many areas of abscess and infection in his abdomen. He also reports that his paperwork noted that should he develop any of the symptoms that he should Ontak someone so he decided to come in tonight for further evaluation. The patient reports that he did have some loose stool today and his surgery was done by Dr. Michela Pitcher.The patient reports that he is more nauseous than in pain but his pain was 8 out of 10 in intensity when he arrived to the hospital.   History reviewed. No pertinent past medical history.  Patient Active Problem List   Diagnosis Date Noted  . Appendicitis 02/20/2015  . Acute appendicitis with localized peritonitis     Past Surgical History  Procedure Laterality Date  . Laparoscopic appendectomy N/A 02/20/2015    Procedure: APPENDECTOMY LAPAROSCOPIC;  Surgeon: Tiney Rouge III, MD;  Location: [facility] ORS;  Service: General;  Laterality: N/A;  . Anterior fusion  cervical spine      Current Outpatient Rx  Name  Route  Sig  Dispense  Refill  . B Complex-C (SUPER B COMPLEX PO)   Oral   Take 1 tablet by mouth daily.         . ciprofloxacin (CIPRO) 500 MG tablet   Oral   Take 1 tablet (500 mg total) by mouth 2 (two) times daily.   20 tablet   1   . finasteride (PROSCAR) 5 MG tablet   Oral   Take 5 mg by mouth daily.         . Flaxseed, Linseed, (FLAXSEED OIL PO)   Oral   Take 1 capsule by mouth 3 (three) times daily.         Marland Kitchen HYDROcodone-acetaminophen (NORCO) 5-325 MG per tablet   Oral   Take 1-2 tablets by mouth every 6 (six) hours as needed for moderate pain.   30 tablet   0   . ibuprofen (ADVIL,MOTRIN) 200 MG tablet   Oral   Take 400 mg by mouth every 6 (six) hours as needed for mild pain.         . metroNIDAZOLE (FLAGYL) 500 MG tablet   Oral   Take 1 tablet (500 mg total) by mouth 3 (three) times daily.   30 tablet   1   . ondansetron (ZOFRAN ODT) 4 MG disintegrating tablet   Oral   Take 1 tablet (4 mg total) by mouth every 8 (eight) hours as needed for nausea or vomiting.  20 tablet   0   . oxyCODONE (OXY IR/ROXICODONE) 5 MG immediate release tablet   Oral   Take 1-2 tablets (5-10 mg total) by mouth every 4 (four) hours as needed for moderate pain.   30 tablet   0     Allergies Review of patient's allergies indicates no known allergies.  History reviewed. No pertinent family history.  Social History Social History  Substance Use Topics  . Smoking status: Never Smoker   . Smokeless tobacco: Never Used  . Alcohol Use: No     Comment: occasional    Review of Systems Constitutional: Cold sweats Eyes: No visual changes. ENT: No sore throat. Cardiovascular: Denies chest pain. Respiratory: Denies shortness of breath. Gastrointestinal: bdominal pain.   nausea, vomiting.   Genitourinary: Negative for dysuria. Musculoskeletal: Right sided low back pain Skin: Negative for rash. Neurological:  Negative for headaches, focal weakness or numbness.  10-point ROS otherwise negative.  ____________________________________________   PHYSICAL EXAM:  VITAL SIGNS: ED Triage Vitals  Enc Vitals Group     BP 02/24/15 2250 125/78 mmHg     Pulse Rate 02/24/15 2250 70     Resp 02/24/15 2250 20     Temp 02/24/15 2250 97.8 F (36.6 C)     Temp Source 02/24/15 2250 Oral     SpO2 02/24/15 2250 97 %     Weight 02/24/15 2250 187 lb (84.823 kg)     Height 02/24/15 2250  (1.727 m)     Head Cir --      Peak Flow --      Pain Score 02/24/15 2256 8     Pain Loc --      Pain Edu? --      Excl. in GC? --     Constitutional: Alert and oriented. Well appearing and in mild distress. Eyes: Conjunctivae are normal. PERRL. EOMI. Head: Atraumatic. Nose: No congestion/rhinnorhea. Mouth/Throat: Mucous membranes are moist.  Oropharynx non-erythematous. Cardiovascular: Normal rate, regular rhythm. Grossly normal heart sounds.  Good peripheral circulation. Respiratory: Normal respiratory effort.  No retractions. Lungs CTAB. Gastrointestinal: Soft with some mild right lower quadrant tenderness to palpation No distention. Positive bowel sounds Musculoskeletal: No lower extremity tenderness nor edema.   Neurologic:  Normal speech and language.  Skin:  Skin is warm, dry and intact.  Psychiatric: Mood and affect are normal.   ____________________________________________   LABS (all labs ordered are listed, but only abnormal results are displayed)  Labs Reviewed  COMPREHENSIVE METABOLIC PANEL - Abnormal; Notable for the following:    Glucose, Bld 135 (*)    All other components within normal limits  CBC - Abnormal; Notable for the following:    WBC 16.3 (*)    All other components within normal limits  URINALYSIS COMPLETEWITH MICROSCOPIC ([facility] ONLY) - Abnormal; Notable for the following:    Color, Urine AMBER (*)    APPearance CLEAR (*)    Ketones, ur 2+ (*)    Leukocytes, UA 1+ (*)     Bacteria, UA RARE (*)    Squamous Epithelial / LPF 0-5 (*)    All other components within normal limits  LIPASE, BLOOD   ____________________________________________  EKG  ED ECG REPORT I, Rebecka Apley, the attending physician, personally viewed and interpreted this ECG.   Date: 02/24/2015  EKG Time: 2304  Rate: 73  Rhythm: normal EKG, normal sinus rhythm  Axis: normal  Intervals:none  ST&T Change: none  ____________________________________________  RADIOLOGY  CT abdomen and  pelvis: Interval appendectomy, Pericecal inflammatory changes consistent with history of appendicitis and preceding surgery, Small focal fluid collection is seen between small bowel loops in the inferior right pelvis 2.6x1.9x2.6, question sterile versus infected postoperative collection ____________________________________________   PROCEDURES  Procedure(s) performed: None  Critical Care performed: No  ____________________________________________   INITIAL IMPRESSION / ASSESSMENT AND PLAN / ED COURSE  Pertinent labs & imaging results that were available during my care of the patient were reviewed by me and considered in my medical decision making (see chart for details).  This is a 54 year old male who had an appendectomy approximately 5 days ago. Patient comes in with increasing pain nausea and vomiting since today. The patient has a white blood cell count of 16 but recently had an elevated white blood cell count of 22 and 24. I will do a CT scan to evaluate for possible new abscess. I will also give the patient a liter of normal saline and reassess the patient once he received his CT scan. Did receive a dose of Zofran while in the hospital.  The patient was able to drink his contrast without any emesis. I discussed the case with Dr. Copper and her recommended continuing the antibiotics and having the patient follow up in the office since the CT scan was negative. The patient will be discharged  to home to follow up with Surgery. ____________________________________________   FINAL CLINICAL IMPRESSION(S) / ED DIAGNOSES  Final diagnoses:  Right lower quadrant abdominal pain  Non-intractable vomiting with nausea, vomiting of unspecified type      Rebecka Apley, MD 02/25/15 0321

## 2015-02-25 ENCOUNTER — Emergency Department: Payer: 59

## 2015-02-25 ENCOUNTER — Telehealth: Payer: Self-pay | Admitting: Surgery

## 2015-02-25 MED ORDER — IOHEXOL 240 MG/ML SOLN
25.0000 mL | INTRAMUSCULAR | Status: AC
  Administered 2015-02-25: 25 mL via ORAL

## 2015-02-25 MED ORDER — IOHEXOL 300 MG/ML  SOLN
100.0000 mL | Freq: Once | INTRAMUSCULAR | Status: AC | PRN
  Administered 2015-02-25: 100 mL via INTRAVENOUS

## 2015-02-25 MED ORDER — ONDANSETRON 4 MG PO TBDP: 4.0000 mg | ORAL_TABLET | Freq: Three times a day (TID) | ORAL | Status: DC | PRN

## 2015-02-25 NOTE — Telephone Encounter (Signed)
Patient called back. Patient reports that his nausea and pain are better today and his is able to eat a light meal. Post op appointment was setup for 03/01/15 in the  [place] office at 11:15 with Dr Michela Pitcher. Patient confirmed appointment.

## 2015-02-25 NOTE — Telephone Encounter (Signed)
Patient had to go to ED Sunday for cold sweats, vomiting, pain in lower Right side, shortness of breath. Had CAT scan and it showed a little fluid but that was all. ED told patient to call us and let you know what was going on. Dr Michela Pitcher did appendectomy on patient

## 2015-02-25 NOTE — Discharge Instructions (Signed)
Nausea and Vomiting  Nausea is a sick feeling that often comes before throwing up (vomiting). Vomiting is a reflex where stomach contents come out of your mouth. Vomiting can cause severe loss of body fluids (dehydration). Children and elderly adults can become dehydrated quickly, especially if they also have diarrhea. Nausea and vomiting are symptoms of a condition or disease. It is important to find the cause of your symptoms.  CAUSES    Direct irritation of the stomach lining. This irritation can result from increased acid production (gastroesophageal reflux disease), infection, food poisoning, taking certain medicines (such as nonsteroidal anti-inflammatory drugs), alcohol use, or tobacco use.   Signals from the brain.These signals could be caused by a headache, heat exposure, an inner ear disturbance, increased pressure in the brain from injury, infection, a tumor, or a concussion, pain, emotional stimulus, or metabolic problems.   An obstruction in the gastrointestinal tract (bowel obstruction).   Illnesses such as diabetes, hepatitis, gallbladder problems, appendicitis, kidney problems, cancer, sepsis, atypical symptoms of a heart attack, or eating disorders.   Medical treatments such as chemotherapy and radiation.   Receiving medicine that makes you sleep (general anesthetic) during surgery.  DIAGNOSIS  Your caregiver may ask for tests to be done if the problems do not improve after a few days. Tests may also be done if symptoms are severe or if the reason for the nausea and vomiting is not clear. Tests may include:   Urine tests.   Blood tests.   Stool tests.   Cultures (to look for evidence of infection).   X-rays or other imaging studies.  Test results can help your caregiver make decisions about treatment or the need for additional tests.  TREATMENT  You need to stay well hydrated. Drink frequently but in small amounts.You may wish to drink water, sports drinks, clear broth, or eat frozen  ice pops or gelatin dessert to help stay hydrated.When you eat, eating slowly may help prevent nausea.There are also some antinausea medicines that may help prevent nausea.  HOME CARE INSTRUCTIONS    Take all medicine as directed by your caregiver.   If you do not have an appetite, do not force yourself to eat. However, you must continue to drink fluids.   If you have an appetite, eat a normal diet unless your caregiver tells you differently.   Eat a variety of complex carbohydrates (rice, wheat, potatoes, bread), lean meats, yogurt, fruits, and vegetables.   Avoid high-fat foods because they are more difficult to digest.   Drink enough water and fluids to keep your urine clear or pale yellow.   If you are dehydrated, ask your caregiver for specific rehydration instructions. Signs of dehydration may include:   Severe thirst.   Dry lips and mouth.   Dizziness.   Dark urine.   Decreasing urine frequency and amount.   Confusion.   Rapid breathing or pulse.  SEEK IMMEDIATE MEDICAL CARE IF:    You have blood or brown flecks (like coffee grounds) in your vomit.   You have black or bloody stools.   You have a severe headache or stiff neck.   You are confused.   You have severe abdominal pain.   You have chest pain or trouble breathing.   You do not urinate at least once every 8 hours.   You develop cold or clammy skin.   You continue to vomit for longer than 24 to 48 hours.   You have a fever.  MAKE SURE YOU:      Understand these instructions.   Will watch your condition.   Will get help right away if you are not doing well or get worse.  Document Released: 05/25/2005 Document Revised: 08/17/2011 Document Reviewed: 10/22/2010  [employer] Patient Information 2015 [employer]. This information is not intended to replace advice given to you by your health care provider. Make sure you discuss any questions you have with your health care provider.      Abdominal Pain  Many things can cause  abdominal pain. Usually, abdominal pain is not caused by a disease and will improve without treatment. It can often be observed and treated at home. Your health care provider will do a physical exam and possibly order blood tests and X-rays to help determine the seriousness of your pain. However, in many cases, more time must pass before a clear cause of the pain can be found. Before that point, your health care provider may not know if you need more testing or further treatment.  HOME CARE INSTRUCTIONS   Monitor your abdominal pain for any changes. The following actions may help to alleviate any discomfort you are experiencing:   Only take over-the-counter or prescription medicines as directed by your health care provider.   Do not take laxatives unless directed to do so by your health care provider.   Try a clear liquid diet (broth, tea, or water) as directed by your health care provider. Slowly move to a bland diet as tolerated.  SEEK MEDICAL CARE IF:   You have unexplained abdominal pain.   You have abdominal pain associated with nausea or diarrhea.   You have pain when you urinate or have a bowel movement.   You experience abdominal pain that wakes you in the night.   You have abdominal pain that is worsened or improved by eating food.   You have abdominal pain that is worsened with eating fatty foods.   You have a fever.  SEEK IMMEDIATE MEDICAL CARE IF:    Your pain does not go away within 2 hours.   You keep throwing up (vomiting).   Your pain is felt only in portions of the abdomen, such as the right side or the left lower portion of the abdomen.   You pass bloody or black tarry stools.  MAKE SURE YOU:   Understand these instructions.    Will watch your condition.    Will get help right away if you are not doing well or get worse.   Document Released: 03/04/2005 Document Revised: 05/30/2013 Document Reviewed: 02/01/2013  [employer] Patient Information 2015 [employer]. This information  is not intended to replace advice given to you by your health care provider. Make sure you discuss any questions you have with your health care provider.

## 2015-02-25 NOTE — Telephone Encounter (Signed)
No answer. Left message on answering machine.

## 2015-02-25 NOTE — ED Notes (Signed)
Pt 4 days post op appendectomy; surgery performed here by Dr Michela Pitcher; c/o right lower quadrant and right flank pain; pt says he felt like the pressure to the area was making it hard to breathe; after vomiting the first time he felt better;

## 2015-02-25 NOTE — ED Notes (Signed)
Patient transported to CT 

## 2015-02-26 NOTE — Anesthesia Postprocedure Evaluation (Signed)
  Anesthesia Post-op Note  Patient: Eric Anderson  Procedure(s) Performed: Procedure(s): APPENDECTOMY LAPAROSCOPIC (N/A)  Anesthesia type:General  Patient location: PACU  Post pain: Pain level controlled  Post assessment: Post-op Vital signs reviewed, Patient's Cardiovascular Status Stable, Respiratory Function Stable, Patent Airway and No signs of Nausea or vomiting  Post vital signs: Reviewed and stable  Last Vitals:  Filed Vitals:   02/22/15 0947  BP:   Pulse:   Temp: 36.6 C  Resp:     Level of consciousness: awake, alert  and patient cooperative  Complications: No apparent anesthesia complications

## 2015-02-27 DIAGNOSIS — E559 Vitamin D deficiency, unspecified: Secondary | ICD-10-CM | POA: Insufficient documentation

## 2015-02-27 DIAGNOSIS — E785 Hyperlipidemia, unspecified: Secondary | ICD-10-CM | POA: Insufficient documentation

## 2015-02-27 DIAGNOSIS — T7840XA Allergy, unspecified, initial encounter: Secondary | ICD-10-CM | POA: Insufficient documentation

## 2015-02-27 DIAGNOSIS — N400 Enlarged prostate without lower urinary tract symptoms: Secondary | ICD-10-CM | POA: Insufficient documentation

## 2015-02-28 ENCOUNTER — Telehealth: Payer: Self-pay | Admitting: Surgery

## 2015-02-28 NOTE — Telephone Encounter (Signed)
Patient stated that he has been taking an antibiotic and its causing stomach problems, diarrhea. He would like to discuss other options. Patient has an appendectomy with Dr Michela Pitcher on 02/20/15.

## 2015-02-28 NOTE — Telephone Encounter (Signed)
Spoke with patient at this time. He explains that he was seen in Emergency Room on Sunday night for nausea, vomiting, diarrhea, and abdominal pain. C-Diff was not ordered at that time and WBC looks better from admission.  Pt states that he is still having these symptoms. Denies fever. But states that he has been having chills.   Spoke with Dr. Michela Pitcher who would like patient to come in today to be seen.  Asked patient to come in, patient explains that this would be extremely inconvenient today as he does not have a ride, etc. Explained that we will see him at his follow-up appointment tomorrow.

## 2015-03-01 ENCOUNTER — Encounter: Payer: Self-pay | Admitting: Surgery

## 2015-03-01 ENCOUNTER — Ambulatory Visit (INDEPENDENT_AMBULATORY_CARE_PROVIDER_SITE_OTHER): Payer: 59 | Admitting: Surgery

## 2015-03-01 VITALS — BP 150/82 | HR 71 | Temp 98.3°F | Ht 68.0 in | Wt 187.0 lb

## 2015-03-01 DIAGNOSIS — K352 Acute appendicitis with generalized peritonitis, without abscess: Secondary | ICD-10-CM

## 2015-03-01 NOTE — Progress Notes (Signed)
Outpatient Surgical Follow Up  03/01/2015  Eric Anderson is an 54 y.o. male.   Chief Complaint  Patient presents with  . Routine Post Op    Laparoscopic Appendectomy 02/20/2015 Dr. Michela Pitcher    HPI: He returns for follow-up after his laparoscopic appendectomy. He had a severe suppurative appendicitis with areas of necrosis but no obvious perforation. There was frank pus in his pelvis. He was treated with IV antibiotics for several days and then switched over to oral antibiotics. Day after discharge she complained of continued abdominal discomfort and went back to the emergency room her repeat CT scan was performed. No significant abnormalities were identified on the CAT scan. He was continued with his antibiotics. He returns today for follow-up.  He's had mild diarrhea with some incontinence. He's not had any fever or chills. He does have some night sweats. He has some mild nausea which responds to 10 nausea medication. He still on his oral antibiotics.  Past Medical History  Diagnosis Date  . Urine retention     Past Surgical History  Procedure Laterality Date  . Laparoscopic appendectomy N/A 02/20/2015    Procedure: APPENDECTOMY LAPAROSCOPIC;  Surgeon: Tiney Rouge III, MD;  Location: [facility] ORS;  Service: General;  Laterality: N/A;  . Anterior fusion cervical spine      Family History  Problem Relation Age of Onset  . Heart disease Father   . Diabetes Sister   . Diabetes Brother     Social History:  reports that he has never smoked. He has never used smokeless tobacco. He reports that he does not drink alcohol or use illicit drugs.  Allergies: No Known Allergies  Medications reviewed.    ROS    BP 150/82 mmHg  Pulse 71  Temp(Src) 98.3 F (36.8 C) (Oral)  Ht  (1.727 m)  Wt 84.823 kg (187 lb)  BMI 28.44 kg/m2  Physical Exam His wounds look good. Sutures were removed. There is no evidence for any infection. There is no significant bruising in his abdomen is  otherwise benign.    No results found for this or any previous visit (from the past 48 hour(s)). No results found.  Assessment/Plan:  1. Acute appendicitis with generalized peritonitis He seems to be recovering relatively well with such a severe appendicitis. I'm treating him as though his ruptured although there was no demonstration of acute rupture. We'll continue his antibiotics for 10 days. If he continues having diarrhea we'll check a Clostridium difficile titer at the present time I suspect that the changes he is experiencing are simply related to his long-term therapy. We discussed his restrictions for work. We'll plan to see him back in our office as necessary.     Tiney Rouge III  03/01/2015,negative

## 2015-03-01 NOTE — Patient Instructions (Addendum)
Please give [place] a call if you have any questions or concerns. Please finish your antibiotics. If you don't feel better two days after your antibiotics, please call us.

## 2015-03-08 ENCOUNTER — Telehealth: Payer: Self-pay | Admitting: General Surgery

## 2015-03-08 ENCOUNTER — Telehealth: Payer: Self-pay | Admitting: Surgery

## 2015-03-08 NOTE — Telephone Encounter (Signed)
Patient called and Mentioned being in pain, he had requested to go back to work early and now in pain he wants updated paper work or note to excuse him for a few days.

## 2015-03-08 NOTE — Telephone Encounter (Signed)
Patient called stating that he had returned to work this Wednesday. He also stated that he was not feeling better and would want to be out of work for another week. I told him that we would fax his employer and [employer] the disability form for him to return back to work on 03/18/2015 with no restrictions.  I also told him that if he continued feeling worse, to give [place] a call so we could schedule an appointment to be seen by one of our surgeons. Patient understood.

## 2015-03-11 NOTE — Telephone Encounter (Signed)
Called patient to let him know that I have called Eric Anderson (his Ambulance person) and left her numerous voicemail for her to return my phone call. Patient stated that he would try to speak with her today no matter what. I told him that if Mrs. Alexander needed any else, to give me a call.

## 2019-10-24 ENCOUNTER — Other Ambulatory Visit: Payer: Self-pay | Admitting: Family Medicine

## 2019-10-24 DIAGNOSIS — M545 Low back pain, unspecified: Secondary | ICD-10-CM

## 2019-11-08 ENCOUNTER — Other Ambulatory Visit: Payer: Self-pay

## 2019-11-08 ENCOUNTER — Ambulatory Visit
Admission: RE | Admit: 2019-11-08 | Discharge: 2019-11-08 | Disposition: A | Discharge status: Home / Self Care | Payer: Managed Care, Other (non HMO) | Source: Ambulatory Visit | Attending: Family Medicine | Admitting: Family Medicine

## 2019-11-08 DIAGNOSIS — M545 Low back pain, unspecified: Secondary | ICD-10-CM

## 2020-05-13 ENCOUNTER — Other Ambulatory Visit
Admission: RE | Admit: 2020-05-13 | Discharge: 2020-05-13 | Disposition: A | Discharge status: Home / Self Care | Payer: Managed Care, Other (non HMO) | Source: Ambulatory Visit | Attending: Internal Medicine | Admitting: Internal Medicine

## 2020-05-13 ENCOUNTER — Other Ambulatory Visit: Payer: Self-pay

## 2020-05-13 DIAGNOSIS — Z01812 Encounter for preprocedural laboratory examination: Secondary | ICD-10-CM | POA: Diagnosis present

## 2020-05-13 DIAGNOSIS — Z20822 Contact with and (suspected) exposure to covid-19: Secondary | ICD-10-CM | POA: Diagnosis not present

## 2020-05-14 ENCOUNTER — Encounter: Payer: Self-pay | Admitting: Internal Medicine

## 2020-05-14 LAB — SARS CORONAVIRUS 2 (TAT 6-24 HRS): SARS Coronavirus 2: NEGATIVE

## 2020-05-15 ENCOUNTER — Ambulatory Visit: Payer: Managed Care, Other (non HMO) | Admitting: Certified Registered Nurse Anesthetist

## 2020-05-15 ENCOUNTER — Ambulatory Visit
Admission: RE | Admit: 2020-05-15 | Discharge: 2020-05-15 | Disposition: A | Discharge status: Home / Self Care | Payer: Managed Care, Other (non HMO) | Attending: Internal Medicine | Admitting: Internal Medicine

## 2020-05-15 ENCOUNTER — Encounter
Admission: RE | Disposition: A | Discharge status: Home / Self Care | Payer: Self-pay | Source: Home / Self Care | Attending: Internal Medicine

## 2020-05-15 ENCOUNTER — Encounter: Payer: Self-pay | Admitting: Internal Medicine

## 2020-05-15 DIAGNOSIS — Z1211 Encounter for screening for malignant neoplasm of colon: Secondary | ICD-10-CM | POA: Insufficient documentation

## 2020-05-15 DIAGNOSIS — Z79899 Other long term (current) drug therapy: Secondary | ICD-10-CM | POA: Diagnosis not present

## 2020-05-15 DIAGNOSIS — K640 First degree hemorrhoids: Secondary | ICD-10-CM | POA: Insufficient documentation

## 2020-05-15 DIAGNOSIS — Z86010 Personal history of colonic polyps: Secondary | ICD-10-CM | POA: Diagnosis not present

## 2020-05-15 HISTORY — PX: COLONOSCOPY WITH PROPOFOL: SHX5780

## 2020-05-15 HISTORY — DX: Hyperlipidemia, unspecified: E78.5

## 2020-05-15 HISTORY — DX: Personal history of other diseases of male genital organs: Z87.438

## 2020-05-15 SURGERY — COLONOSCOPY WITH PROPOFOL
Anesthesia: General

## 2020-05-15 MED ORDER — LIDOCAINE HCL (CARDIAC) PF 100 MG/5ML IV SOSY
PREFILLED_SYRINGE | INTRAVENOUS | Status: DC | PRN
  Administered 2020-05-15: 50 mg via INTRAVENOUS

## 2020-05-15 MED ORDER — PROPOFOL 10 MG/ML IV BOLUS
INTRAVENOUS | Status: DC | PRN
  Administered 2020-05-15: 90 mg via INTRAVENOUS
  Administered 2020-05-15: 23 mg via INTRAVENOUS

## 2020-05-15 MED ORDER — PROPOFOL 500 MG/50ML IV EMUL
INTRAVENOUS | Status: AC
  Filled 2020-05-15: qty 50

## 2020-05-15 MED ORDER — SODIUM CHLORIDE 0.9 % IV SOLN: INTRAVENOUS | Status: DC

## 2020-05-15 MED ORDER — PROPOFOL 500 MG/50ML IV EMUL
INTRAVENOUS | Status: DC | PRN
  Administered 2020-05-15: 135 ug/kg/min via INTRAVENOUS

## 2020-05-15 NOTE — Anesthesia Postprocedure Evaluation (Signed)
Anesthesia Post Note  Patient: Eric Anderson  Procedure(s) Performed: COLONOSCOPY WITH PROPOFOL (N/A )  Patient location during evaluation: PACU Anesthesia Type: General Level of consciousness: awake and alert Pain management: pain level controlled Vital Signs Assessment: post-procedure vital signs reviewed and stable Respiratory status: spontaneous breathing, nonlabored ventilation, respiratory function stable and patient connected to nasal cannula oxygen Cardiovascular status: blood pressure returned to baseline and stable Postop Assessment: no apparent nausea or vomiting Anesthetic complications: no   No complications documented.   Last Vitals:  Vitals:   05/15/20 1145 05/15/20 1215  BP: 125/72 (!) 131/100  Pulse:    Resp:    Temp: 36.8 C   SpO2:      Last Pain:  Vitals:   05/15/20 1215  TempSrc:   PainSc: 0-No pain                 Yevette Edwards

## 2020-05-15 NOTE — Anesthesia Preprocedure Evaluation (Signed)
Anesthesia Evaluation  Patient identified by MRN, date of birth, ID band Patient awake    Reviewed: Allergy & Precautions, H&P , NPO status , Patient's Chart, lab work & pertinent test results, reviewed documented beta blocker date and time   Airway Mallampati: II   Neck ROM: full    Dental  (+) Teeth Intact   Pulmonary neg pulmonary ROS,    Pulmonary exam normal        Cardiovascular negative cardio ROS Normal cardiovascular exam Rhythm:regular Rate:Normal     Neuro/Psych negative neurological ROS  negative psych ROS   GI/Hepatic negative GI ROS, Neg liver ROS,   Endo/Other  negative endocrine ROS  Renal/GU negative Renal ROS  negative genitourinary   Musculoskeletal   Abdominal   Peds  Hematology negative hematology ROS (+)   Anesthesia Other Findings Past Medical History: No date: History of BPH No date: Hyperlipidemia No date: Urine retention Past Surgical History: No date: ANTERIOR FUSION CERVICAL SPINE 02/20/2015: LAPAROSCOPIC APPENDECTOMY; N/A     Comment:  Procedure: APPENDECTOMY LAPAROSCOPIC;  Surgeon: Tiney Rouge III, MD;  Location: [facility] ORS;  Service: General;                Laterality: N/A; BMI    Body Mass Index: 31.17 kg/m     Reproductive/Obstetrics negative OB ROS                             Anesthesia Physical Anesthesia Plan  ASA: II  Anesthesia Plan: General   Post-op Pain Management:    Induction:   PONV Risk Score and Plan:   Airway Management Planned:   Additional Equipment:   Intra-op Plan:   Post-operative Plan:   Informed Consent: I have reviewed the patients History and Physical, chart, labs and discussed the procedure including the risks, benefits and alternatives for the proposed anesthesia with the patient or authorized representative who has indicated his/her understanding and acceptance.     Dental Advisory Given  Plan  Discussed with: CRNA  Anesthesia Plan Comments:         Anesthesia Quick Evaluation

## 2020-05-15 NOTE — H&P (Signed)
Outpatient short stay form Pre-procedure 05/15/2020 11:16 AM Jabri Blancett K. Norma Fredrickson, M.D.  Primary Physician: [name], M.D.  Reason for visit:  Personal history of adenomatous colon polyps  History of present illness:                             Patient presents for colonoscopy for a personal hx of colon polyps. The patient denies abdominal pain, abnormal weight loss or rectal bleeding.     Current Facility-Administered Medications:  .  0.9 %  sodium chloride infusion, , Intravenous, Continuous, Combs, Boykin Nearing, MD, Last Rate: 20 mL/hr at 05/15/20 1015, Continued from Pre-op at 05/15/20 1015  Medications Prior to Admission  Medication Sig Dispense Refill Last Dose  . B Complex-C (SUPER B COMPLEX PO) Take 1 tablet by mouth daily.   Past Week at Unknown time  . cyclobenzaprine (FLEXERIL) 5 MG tablet Take 5 mg by mouth 3 (three) times daily as needed for muscle spasms.   Past Month at Unknown time  . finasteride (PROSCAR) 5 MG tablet Take 5 mg by mouth daily.   Past Week at Unknown time  . Flaxseed, Linseed, (FLAXSEED OIL PO) Take 1 capsule by mouth 3 (three) times daily.   Past Week at Unknown time  . ibuprofen (ADVIL,MOTRIN) 200 MG tablet Take 400 mg by mouth every 6 (six) hours as needed for mild pain.   05/14/2020 at Unknown time  . omeprazole (PRILOSEC) 20 MG capsule Take 20 mg by mouth daily.   Past Week at Unknown time  . tadalafil (CIALIS) 20 MG tablet Take 20 mg by mouth daily as needed for erectile dysfunction.     Marland Kitchen venlafaxine XR (EFFEXOR-XR) 150 MG 24 hr capsule Take 150 mg by mouth daily with breakfast.   05/14/2020 at Unknown time  . ciprofloxacin (CIPRO) 500 MG tablet Take 1 tablet (500 mg total) by mouth 2 (two) times daily. (Patient not taking: Reported on 05/15/2020) 20 tablet 1 Not Taking at Unknown time  . meloxicam (MOBIC) 15 MG tablet Take 15 mg by mouth daily. (Patient not taking: Reported on 05/15/2020)   Not Taking at Unknown time  . metroNIDAZOLE (FLAGYL) 500 MG  tablet Take 1 tablet (500 mg total) by mouth 3 (three) times daily. (Patient not taking: Reported on 05/15/2020) 30 tablet 1 Not Taking at Unknown time  . ondansetron (ZOFRAN ODT) 4 MG disintegrating tablet Take 1 tablet (4 mg total) by mouth every 8 (eight) hours as needed for nausea or vomiting. 20 tablet 0      No Known Allergies   Past Medical History:  Diagnosis Date  . History of BPH   . Hyperlipidemia   . Urine retention     Review of systems:  Otherwise negative.    Physical Exam  Gen: Alert, oriented. Appears stated age.  HEENT: [place]/AT. PERRLA. Lungs: CTA, no wheezes. CV: RR nl S1, S2. Abd: soft, benign, no masses. BS+ Ext: No edema. Pulses 2+    Planned procedures: Proceed with colonoscopy. The patient understands the nature of the planned procedure, indications, risks, alternatives and potential complications including but not limited to bleeding, infection, perforation, damage to internal organs and possible oversedation/side effects from anesthesia. The patient agrees and gives consent to proceed.  Please refer to procedure notes for findings, recommendations and patient disposition/instructions.     Mansfield Dann K. Norma Fredrickson, M.D. Gastroenterology 05/15/2020  11:16 AM

## 2020-05-15 NOTE — Transfer of Care (Signed)
Immediate Anesthesia Transfer of Care Note  Patient: MYKLE PASCUA  Procedure(s) Performed: COLONOSCOPY WITH PROPOFOL (N/A )  Patient Location: PACU and Endoscopy Unit  Anesthesia Type:General  Level of Consciousness: awake, alert , oriented and patient cooperative  Airway & Oxygen Therapy: Patient Spontanous Breathing  Post-op Assessment: Report given to RN and Post -op Vital signs reviewed and stable  Post vital signs: Reviewed and stable  Last Vitals:  Vitals Value Taken Time  BP 125/72 05/15/20 1145  Temp 36.8 C 05/15/20 1145  Pulse 65 05/15/20 1146  Resp 18 05/15/20 1146  SpO2 100 % 05/15/20 1146  Vitals shown include unvalidated device data.  Last Pain:  Vitals:   05/15/20 1145  TempSrc: Temporal  PainSc: 0-No pain         Complications: No complications documented.

## 2020-05-15 NOTE — Op Note (Signed)
[facility] Patient Name: Eric Anderson Procedure Date: 05/15/2020 11:14 AM MRN: [id number] Account #: [id number] Date of Birth: 06-15-1960 Admit Type: Outpatient Age: 59 Room: [facility] ENDO ROOM 2 Gender: Male Note Status: Finalized Procedure:             Colonoscopy Indications:           Surveillance: Personal history of adenomatous polyps                         on last colonoscopy > 5 years ago Providers:             Royce Macadamia K. Hibba Schram MD, MD Medicines:             Monitored Anesthesia Care, Propofol per Anesthesia Complications:         No immediate complications. Procedure:             Pre-Anesthesia Assessment:                        - The risks and benefits of the procedure and the                         sedation options and risks were discussed with the                         patient. All questions were answered and informed                         consent was obtained.                        - Patient identification and proposed procedure were                         verified prior to the procedure by the nurse. The                         procedure was verified in the procedure room.                        - ASA Grade Assessment: III - A patient with severe                         systemic disease.                        - After reviewing the risks and benefits, the patient                         was deemed in satisfactory condition to undergo the                         procedure.                        After obtaining informed consent, the colonoscope was                         passed under direct vision. Throughout the procedure,  the patient's blood pressure, pulse, and oxygen                         saturations were monitored continuously. The                         Colonoscope was introduced through the anus and                         advanced to the the cecum, identified by appendiceal                          orifice and ileocecal valve. The colonoscopy was                         performed without difficulty. The patient tolerated                         the procedure well. The quality of the bowel                         preparation was excellent. Findings:      The perianal and digital rectal examinations were normal. Pertinent       negatives include normal sphincter tone and no palpable rectal lesions.      Non-bleeding internal hemorrhoids were found during retroflexion. The       hemorrhoids were Grade I (internal hemorrhoids that do not prolapse).      The entire examined colon appeared normal. Impression:            - Non-bleeding internal hemorrhoids.                        - The entire examined colon is normal.                        - No specimens collected. Recommendation:        - Patient has a contact number available for                         emergencies. The signs and symptoms of potential                         delayed complications were discussed with the patient.                         Return to normal activities tomorrow. Written                         discharge instructions were provided to the patient.                        - Resume previous diet.                        - Continue present medications.                        - Repeat colonoscopy in 5 years for surveillance.                        -  Return to GI clinic PRN.                        - The findings and recommendations were discussed with                         the patient. Procedure Code(s):     --- Professional ---                        D3220, Colorectal cancer screening; colonoscopy on                         individual at high risk Diagnosis Code(s):     --- Professional ---                        K64.0, First degree hemorrhoids                        Z86.010, Personal history of colonic polyps CPT copyright 2019 [employer]. All rights reserved. The codes documented in  this report are preliminary and upon coder review may  be revised to meet current compliance requirements. Stanton Kidney MD, MD 05/15/2020 11:44:18 AM This report has been signed electronically. Number of Addenda: 0 Note Initiated On: 05/15/2020 11:14 AM Scope Withdrawal Time: 0 hours 5 minutes 18 seconds  Total Procedure Duration: 0 hours 7 minutes 59 seconds  Estimated Blood Loss:  Estimated blood loss: none.      [facility]

## 2020-05-15 NOTE — Interval H&P Note (Signed)
History and Physical Interval Note:  05/15/2020 11:18 AM  Eric Anderson  has presented today for surgery, with the diagnosis of personal hx polyps.  The various methods of treatment have been discussed with the patient and family. After consideration of risks, benefits and other options for treatment, the patient has consented to  Procedure(s): COLONOSCOPY WITH PROPOFOL (N/A) as a surgical intervention.  The patient's history has been reviewed, patient examined, no change in status, stable for surgery.  I have reviewed the patient's chart and labs.  Questions were answered to the patient's satisfaction.     [place], [place]

## 2020-05-16 ENCOUNTER — Encounter: Payer: Self-pay | Admitting: Internal Medicine

## 2020-05-21 ENCOUNTER — Ambulatory Visit: Payer: Managed Care, Other (non HMO) | Admitting: Physical Therapy

## 2020-05-23 ENCOUNTER — Encounter: Payer: Managed Care, Other (non HMO) | Admitting: Physical Therapy

## 2020-05-28 ENCOUNTER — Encounter: Payer: Managed Care, Other (non HMO) | Admitting: Physical Therapy

## 2020-05-30 ENCOUNTER — Encounter: Payer: Managed Care, Other (non HMO) | Admitting: Physical Therapy

## 2020-06-10 ENCOUNTER — Encounter: Payer: Managed Care, Other (non HMO) | Admitting: Physical Therapy

## 2020-06-19 ENCOUNTER — Encounter: Payer: Managed Care, Other (non HMO) | Admitting: Physical Therapy

## 2020-06-24 ENCOUNTER — Encounter: Payer: Managed Care, Other (non HMO) | Admitting: Physical Therapy

## 2020-06-26 ENCOUNTER — Encounter: Payer: Managed Care, Other (non HMO) | Admitting: Physical Therapy

## 2020-07-01 ENCOUNTER — Encounter: Payer: Managed Care, Other (non HMO) | Admitting: Physical Therapy

## 2020-07-04 ENCOUNTER — Encounter: Payer: Managed Care, Other (non HMO) | Admitting: Physical Therapy

## 2021-07-12 ENCOUNTER — Emergency Department: Payer: Managed Care, Other (non HMO)

## 2021-07-12 ENCOUNTER — Emergency Department
Admission: EM | Admit: 2021-07-12 | Discharge: 2021-07-12 | Disposition: A | Discharge status: Nursing Home (Includes ICF) | Payer: Managed Care, Other (non HMO) | Attending: Emergency Medicine | Admitting: Emergency Medicine

## 2021-07-12 ENCOUNTER — Encounter (HOSPITAL_COMMUNITY): Payer: Self-pay | Admitting: Cardiovascular Disease

## 2021-07-12 ENCOUNTER — Other Ambulatory Visit: Payer: Self-pay

## 2021-07-12 ENCOUNTER — Encounter: Payer: Self-pay | Admitting: Intensive Care

## 2021-07-12 ENCOUNTER — Encounter (HOSPITAL_COMMUNITY)
Admission: RE | Disposition: A | Discharge status: Home / Self Care | Payer: Self-pay | Source: Home / Self Care | Attending: Cardiovascular Disease

## 2021-07-12 ENCOUNTER — Inpatient Hospital Stay (HOSPITAL_COMMUNITY)
Admission: RE | Admit: 2021-07-12 | Discharge: 2021-07-15 | DRG: 246 | Disposition: A | Discharge status: Home / Self Care | Payer: Managed Care, Other (non HMO) | Attending: Cardiovascular Disease | Admitting: Cardiovascular Disease

## 2021-07-12 DIAGNOSIS — Z20822 Contact with and (suspected) exposure to covid-19: Secondary | ICD-10-CM | POA: Diagnosis not present

## 2021-07-12 DIAGNOSIS — E782 Mixed hyperlipidemia: Secondary | ICD-10-CM | POA: Diagnosis not present

## 2021-07-12 DIAGNOSIS — Z833 Family history of diabetes mellitus: Secondary | ICD-10-CM

## 2021-07-12 DIAGNOSIS — I2111 ST elevation (STEMI) myocardial infarction involving right coronary artery: Secondary | ICD-10-CM | POA: Diagnosis not present

## 2021-07-12 DIAGNOSIS — N400 Enlarged prostate without lower urinary tract symptoms: Secondary | ICD-10-CM | POA: Diagnosis present

## 2021-07-12 DIAGNOSIS — Z955 Presence of coronary angioplasty implant and graft: Secondary | ICD-10-CM

## 2021-07-12 DIAGNOSIS — Z8249 Family history of ischemic heart disease and other diseases of the circulatory system: Secondary | ICD-10-CM

## 2021-07-12 DIAGNOSIS — R001 Bradycardia, unspecified: Secondary | ICD-10-CM | POA: Diagnosis present

## 2021-07-12 DIAGNOSIS — R570 Cardiogenic shock: Secondary | ICD-10-CM | POA: Diagnosis present

## 2021-07-12 DIAGNOSIS — R0789 Other chest pain: Secondary | ICD-10-CM | POA: Diagnosis present

## 2021-07-12 DIAGNOSIS — I213 ST elevation (STEMI) myocardial infarction of unspecified site: Secondary | ICD-10-CM | POA: Diagnosis not present

## 2021-07-12 DIAGNOSIS — I2510 Atherosclerotic heart disease of native coronary artery without angina pectoris: Secondary | ICD-10-CM

## 2021-07-12 DIAGNOSIS — E559 Vitamin D deficiency, unspecified: Secondary | ICD-10-CM | POA: Diagnosis present

## 2021-07-12 DIAGNOSIS — E785 Hyperlipidemia, unspecified: Secondary | ICD-10-CM | POA: Diagnosis present

## 2021-07-12 DIAGNOSIS — Z981 Arthrodesis status: Secondary | ICD-10-CM

## 2021-07-12 DIAGNOSIS — K219 Gastro-esophageal reflux disease without esophagitis: Secondary | ICD-10-CM | POA: Diagnosis present

## 2021-07-12 HISTORY — PX: CORONARY/GRAFT ACUTE MI REVASCULARIZATION: CATH118305

## 2021-07-12 HISTORY — PX: LEFT HEART CATH AND CORONARY ANGIOGRAPHY: CATH118249

## 2021-07-12 LAB — RESP PANEL BY RT-PCR (FLU A&B, COVID) ARPGX2
Influenza A by PCR: NEGATIVE
Influenza B by PCR: NEGATIVE
SARS Coronavirus 2 by RT PCR: NEGATIVE

## 2021-07-12 LAB — PROTIME-INR
INR: 1 (ref 0.8–1.2)
Prothrombin Time: 13.4 seconds (ref 11.4–15.2)

## 2021-07-12 LAB — CBC
HCT: 42.1 % (ref 39.0–52.0)
Hemoglobin: 14.1 g/dL (ref 13.0–17.0)
MCH: 27.4 pg (ref 26.0–34.0)
MCHC: 33.5 g/dL (ref 30.0–36.0)
MCV: 81.9 fL (ref 80.0–100.0)
Platelets: 337 10*3/uL (ref 150–400)
RBC: 5.14 MIL/uL (ref 4.22–5.81)
RDW: 12.7 % (ref 11.5–15.5)
WBC: 12.5 10*3/uL — ABNORMAL HIGH (ref 4.0–10.5)
nRBC: 0 % (ref 0.0–0.2)

## 2021-07-12 LAB — POCT I-STAT, CHEM 8
BUN: 14 mg/dL (ref 6–20)
Calcium, Ion: 1.21 mmol/L (ref 1.15–1.40)
Chloride: 105 mmol/L (ref 98–111)
Creatinine, Ser: 0.6 mg/dL — ABNORMAL LOW (ref 0.61–1.24)
Glucose, Bld: 146 mg/dL — ABNORMAL HIGH (ref 70–99)
HCT: 45 % (ref 39.0–52.0)
Hemoglobin: 15.3 g/dL (ref 13.0–17.0)
Potassium: 3.7 mmol/L (ref 3.5–5.1)
Sodium: 138 mmol/L (ref 135–145)
TCO2: 20 mmol/L — ABNORMAL LOW (ref 22–32)

## 2021-07-12 LAB — COMPREHENSIVE METABOLIC PANEL
ALT: 34 U/L (ref 0–44)
AST: 32 U/L (ref 15–41)
Albumin: 3.9 g/dL (ref 3.5–5.0)
Alkaline Phosphatase: 68 U/L (ref 38–126)
Anion gap: 8 (ref 5–15)
BUN: 13 mg/dL (ref 6–20)
CO2: 19 mmol/L — ABNORMAL LOW (ref 22–32)
Calcium: 8.7 mg/dL — ABNORMAL LOW (ref 8.9–10.3)
Chloride: 108 mmol/L (ref 98–111)
Creatinine, Ser: 0.82 mg/dL (ref 0.61–1.24)
GFR, Estimated: >60 mL/min (ref >60)
Glucose, Bld: 144 mg/dL — ABNORMAL HIGH (ref 70–99)
Potassium: 3.6 mmol/L (ref 3.5–5.1)
Sodium: 135 mmol/L (ref 135–145)
Total Bilirubin: 0.8 mg/dL (ref 0.3–1.2)
Total Protein: 6.6 g/dL (ref 6.5–8.1)

## 2021-07-12 LAB — LIPID PANEL
Cholesterol: 223 mg/dL — ABNORMAL HIGH (ref 0–200)
HDL: 38 mg/dL — ABNORMAL LOW (ref >40)
LDL Cholesterol: 167 mg/dL — ABNORMAL HIGH (ref 0–99)
Total CHOL/HDL Ratio: 5.9 RATIO
Triglycerides: 90 mg/dL (ref <150)
VLDL: 18 mg/dL (ref 0–40)

## 2021-07-12 LAB — APTT: aPTT: 71 seconds — ABNORMAL HIGH (ref 24–36)

## 2021-07-12 LAB — TROPONIN I (HIGH SENSITIVITY): Troponin I (High Sensitivity): 180 ng/L (ref <18)

## 2021-07-12 LAB — HEMOGLOBIN A1C
Hgb A1c MFr Bld: 5.6 % (ref 4.8–5.6)
Mean Plasma Glucose: 114.02 mg/dL

## 2021-07-12 SURGERY — CORONARY/GRAFT ACUTE MI REVASCULARIZATION
Anesthesia: LOCAL

## 2021-07-12 MED ORDER — TICAGRELOR 90 MG PO TABS
ORAL_TABLET | ORAL | Status: DC | PRN
  Administered 2021-07-12: 180 mg via ORAL

## 2021-07-12 MED ORDER — TIROFIBAN (AGGRASTAT) BOLUS VIA INFUSION
INTRAVENOUS | Status: DC | PRN
  Administered 2021-07-12: 2382.5 ug via INTRAVENOUS

## 2021-07-12 MED ORDER — DIAZEPAM 5 MG PO TABS
5.0000 mg | ORAL_TABLET | Freq: Four times a day (QID) | ORAL | Status: DC | PRN
Start: 2021-07-12 — End: 2021-07-15
  Administered 2021-07-13 – 2021-07-14 (×2): 5 mg via ORAL
  Filled 2021-07-12 (×2): qty 1

## 2021-07-12 MED ORDER — LIDOCAINE HCL (PF) 1 % IJ SOLN
INTRAMUSCULAR | Status: DC | PRN
  Administered 2021-07-12: 2 mL via INTRADERMAL

## 2021-07-12 MED ORDER — ENOXAPARIN SODIUM 40 MG/0.4ML IJ SOSY
40.0000 mg | PREFILLED_SYRINGE | INTRAMUSCULAR | Status: DC
  Administered 2021-07-13 – 2021-07-14 (×2): 40 mg via SUBCUTANEOUS
  Filled 2021-07-12 (×2): qty 0.4

## 2021-07-12 MED ORDER — FENTANYL CITRATE (PF) 100 MCG/2ML IJ SOLN
INTRAMUSCULAR | Status: DC | PRN
  Administered 2021-07-12: 50 ug via INTRAVENOUS

## 2021-07-12 MED ORDER — HEPARIN SODIUM (PORCINE) 5000 UNIT/ML IJ SOLN
4000.0000 [IU] | Freq: Once | INTRAMUSCULAR | Status: AC
  Administered 2021-07-12: 4000 [IU] via INTRAVENOUS

## 2021-07-12 MED ORDER — NITROGLYCERIN 1 MG/10 ML FOR IR/CATH LAB
INTRA_ARTERIAL | Status: DC | PRN
  Administered 2021-07-12: 200 ug via INTRACORONARY

## 2021-07-12 MED ORDER — HEPARIN (PORCINE) IN NACL 1000-0.9 UT/500ML-% IV SOLN
INTRAVENOUS | Status: DC | PRN
  Administered 2021-07-12 (×2): 500 mL

## 2021-07-12 MED ORDER — SODIUM CHLORIDE 0.9 % IV SOLN: 250.0000 mL | INTRAVENOUS | Status: DC

## 2021-07-12 MED ORDER — ASPIRIN 81 MG PO CHEW
324.0000 mg | CHEWABLE_TABLET | Freq: Once | ORAL | Status: AC
  Administered 2021-07-12: 324 mg via ORAL

## 2021-07-12 MED ORDER — TIROFIBAN HCL IN NACL 5-0.9 MG/100ML-% IV SOLN
INTRAVENOUS | Status: AC | PRN
  Administered 2021-07-12: .075 ug/kg/min via INTRAVENOUS

## 2021-07-12 MED ORDER — CHLORHEXIDINE GLUCONATE CLOTH 2 % EX PADS
6.0000 | MEDICATED_PAD | Freq: Every day | CUTANEOUS | Status: DC
  Administered 2021-07-12 – 2021-07-15 (×4): 6 via TOPICAL

## 2021-07-12 MED ORDER — MIDAZOLAM HCL 2 MG/2ML IJ SOLN
INTRAMUSCULAR | Status: DC | PRN
  Administered 2021-07-12: 2 mg via INTRAVENOUS

## 2021-07-12 MED ORDER — ASPIRIN 81 MG PO CHEW: 324.0000 mg | CHEWABLE_TABLET | Freq: Once | ORAL | Status: DC

## 2021-07-12 MED ORDER — SODIUM CHLORIDE 0.9% FLUSH: 3.0000 mL | INTRAVENOUS | Status: DC | PRN

## 2021-07-12 MED ORDER — IOHEXOL 350 MG/ML SOLN
INTRAVENOUS | Status: DC | PRN
  Administered 2021-07-12: 185 mL via INTRA_ARTERIAL

## 2021-07-12 MED ORDER — HEPARIN SODIUM (PORCINE) 5000 UNIT/ML IJ SOLN: 4000.0000 [IU] | Freq: Once | INTRAMUSCULAR | Status: DC

## 2021-07-12 MED ORDER — HEPARIN SODIUM (PORCINE) 1000 UNIT/ML IJ SOLN
INTRAMUSCULAR | Status: DC | PRN
  Administered 2021-07-12: 6000 [IU] via INTRAVENOUS
  Administered 2021-07-12: 3000 [IU] via INTRAVENOUS

## 2021-07-12 MED ORDER — ATORVASTATIN CALCIUM 80 MG PO TABS
80.0000 mg | ORAL_TABLET | Freq: Every day | ORAL | Status: DC
  Administered 2021-07-12 – 2021-07-14 (×3): 80 mg via ORAL
  Filled 2021-07-12 (×3): qty 1

## 2021-07-12 MED ORDER — TICAGRELOR 90 MG PO TABS
90.0000 mg | ORAL_TABLET | Freq: Two times a day (BID) | ORAL | Status: DC
  Administered 2021-07-12 – 2021-07-14 (×4): 90 mg via ORAL
  Filled 2021-07-12 (×4): qty 1

## 2021-07-12 MED ORDER — NOREPINEPHRINE BITARTRATE 1 MG/ML IV SOLN
INTRAVENOUS | Status: AC | PRN
  Administered 2021-07-12: 5 ug/min via INTRAVENOUS

## 2021-07-12 MED ORDER — SODIUM CHLORIDE 0.9 % IV SOLN
INTRAVENOUS | Status: AC | PRN
  Administered 2021-07-12: 500 mL via INTRAVENOUS

## 2021-07-12 MED ORDER — ATROPINE SULFATE 1 MG/10ML IJ SOSY
PREFILLED_SYRINGE | INTRAMUSCULAR | Status: DC | PRN
  Administered 2021-07-12: 1 mg via INTRAVENOUS

## 2021-07-12 MED ORDER — SODIUM CHLORIDE 0.9% FLUSH
3.0000 mL | Freq: Two times a day (BID) | INTRAVENOUS | Status: DC
  Administered 2021-07-13 – 2021-07-15 (×4): 3 mL via INTRAVENOUS

## 2021-07-12 MED ORDER — LABETALOL HCL 5 MG/ML IV SOLN: 10.0000 mg | INTRAVENOUS | Status: AC | PRN

## 2021-07-12 MED ORDER — TIROFIBAN HCL IN NACL 5-0.9 MG/100ML-% IV SOLN
0.1500 ug/kg/min | INTRAVENOUS | Status: DC
  Administered 2021-07-12 – 2021-07-13 (×2): 0.15 ug/kg/min via INTRAVENOUS
  Filled 2021-07-12 (×3): qty 100

## 2021-07-12 MED ORDER — ONDANSETRON HCL 4 MG/2ML IJ SOLN: 4.0000 mg | Freq: Four times a day (QID) | INTRAMUSCULAR | Status: DC | PRN

## 2021-07-12 MED ORDER — NOREPINEPHRINE 4 MG/250ML-% IV SOLN: 2.0000 ug/min | INTRAVENOUS | Status: DC

## 2021-07-12 MED ORDER — NOREPINEPHRINE 4 MG/250ML-% IV SOLN: 0.0000 ug/min | INTRAVENOUS | Status: DC

## 2021-07-12 MED ORDER — HYDRALAZINE HCL 20 MG/ML IJ SOLN: 10.0000 mg | INTRAMUSCULAR | Status: AC | PRN

## 2021-07-12 MED ORDER — SODIUM CHLORIDE 0.9 % IV SOLN: INTRAVENOUS | Status: DC

## 2021-07-12 MED ORDER — ACETAMINOPHEN 325 MG PO TABS: 650.0000 mg | ORAL_TABLET | ORAL | Status: DC | PRN

## 2021-07-12 MED ORDER — ZOLPIDEM TARTRATE 5 MG PO TABS: 5.0000 mg | ORAL_TABLET | Freq: Every evening | ORAL | Status: DC | PRN

## 2021-07-12 MED ORDER — VERAPAMIL HCL 2.5 MG/ML IV SOLN
INTRAVENOUS | Status: DC | PRN
  Administered 2021-07-12: 10 mL via INTRA_ARTERIAL

## 2021-07-12 MED ORDER — SODIUM CHLORIDE 0.9 % IV SOLN: 250.0000 mL | INTRAVENOUS | Status: DC | PRN

## 2021-07-12 MED ORDER — ASPIRIN 81 MG PO CHEW
81.0000 mg | CHEWABLE_TABLET | Freq: Every day | ORAL | Status: DC
  Administered 2021-07-13 – 2021-07-14 (×2): 81 mg via ORAL
  Filled 2021-07-12 (×2): qty 1

## 2021-07-12 SURGICAL SUPPLY — 21 items
BALLN SAPPHIRE 2.0X12 (BALLOONS) ×2
BALLN SAPPHIRE 2.5X15 (BALLOONS) ×2
BALLN ~~LOC~~ EUPHORA RX 3.75X15 (BALLOONS) ×2
BALLOON SAPPHIRE 2.0X12 (BALLOONS) IMPLANT
BALLOON SAPPHIRE 2.5X15 (BALLOONS) IMPLANT
BALLOON ~~LOC~~ EUPHORA RX 3.75X15 (BALLOONS) IMPLANT
CATH 5FR JL3.5 JR4 ANG PIG MP (CATHETERS) ×1 IMPLANT
CATH LAUNCHER 6FR JR4 (CATHETERS) ×1 IMPLANT
DEVICE RAD COMP TR BAND LRG (VASCULAR PRODUCTS) ×1 IMPLANT
ELECT DEFIB PAD ADLT CADENCE (PAD) ×1 IMPLANT
GLIDESHEATH SLEND SS 6F .021 (SHEATH) ×1 IMPLANT
GUIDEWIRE INQWIRE 1.5J.035X260 (WIRE) IMPLANT
INQWIRE 1.5J .035X260CM (WIRE) ×2
KIT ENCORE 26 ADVANTAGE (KITS) ×1 IMPLANT
KIT HEART LEFT (KITS) ×2 IMPLANT
PACK CARDIAC CATHETERIZATION (CUSTOM PROCEDURE TRAY) ×2 IMPLANT
STENT ONYX FRONTIER 3.5X26 (Permanent Stent) ×1 IMPLANT
SYR MEDRAD MARK 7 150ML (SYRINGE) ×2 IMPLANT
TRANSDUCER W/STOPCOCK (MISCELLANEOUS) ×2 IMPLANT
TUBING CIL FLEX 10 FLL-RA (TUBING) ×2 IMPLANT
WIRE COUGAR XT STRL 190CM (WIRE) ×1 IMPLANT

## 2021-07-12 NOTE — ED Triage Notes (Addendum)
Patient presents with chest pain, diaphoretic, and arm tingling/numbness for about an hour. A&O x4 upon arrival. Patient slumped back in wheelchair c/o chest tightness

## 2021-07-12 NOTE — H&P (Addendum)
Cardiology Admission History and Physical:   Patient ID: [name] MRN: [id number]; DOB: 12/27/1960   Admission date: 07/12/2021  PCP:  Eric Mina, Anderson   [facility] Providers Cardiologist:  Eric Guadalajara, Anderson   Chief Complaint:  STEMI  Patient Profile:   Eric Anderson is a 61 y.o. male with HLD, BPH, and vitamin D deficiency who is being seen 07/12/2021 for the evaluation of STEMI.  History of Present Illness:   Eric Anderson has no prior cardiac history. He was out shopping with his wife today and developed substernal chest tightness and diaphoresis prompting presentation to [facility] ER. EKG with STE inferior leads and anterolateral leads. CODE STEMI was activated; however, STEMI [facility] at [facility] was not available. Dr. Tresa Anderson notified and accepted in transfer to [facility] Cath lab for revascularization.   ASA and heparin were given PTA, nitro was not given.    Past Medical History:  Diagnosis Date   History of BPH    Hyperlipidemia    Urine retention     Past Surgical History:  Procedure Laterality Date   ANTERIOR FUSION CERVICAL SPINE     COLONOSCOPY WITH PROPOFOL N/A 05/15/2020   Procedure: COLONOSCOPY WITH PROPOFOL;  Surgeon: Eric Anderson;  Location: [facility] ENDOSCOPY;  Service: Gastroenterology;  Laterality: N/A;   LAPAROSCOPIC APPENDECTOMY N/A 02/20/2015   Procedure: APPENDECTOMY LAPAROSCOPIC;  Surgeon: Eric Rouge III, Anderson;  Location: [facility] ORS;  Service: General;  Laterality: N/A;     Medications Prior to Admission: Prior to Admission medications   Medication Sig Start Date End Date Taking? Authorizing Provider  B Complex-C (SUPER B COMPLEX PO) Take 1 tablet by mouth daily.    Provider, Historical, Anderson  ciprofloxacin (CIPRO) 500 MG tablet Take 1 tablet (500 mg total) by mouth 2 (two) times daily. Patient not taking: Reported on 05/15/2020 02/22/15   Eric Rouge III, Anderson  cyclobenzaprine (FLEXERIL) 5 MG tablet Take 5 mg by mouth 3 (three) times daily as needed for muscle  spasms.    Provider, Historical, Anderson  finasteride (PROSCAR) 5 MG tablet Take 5 mg by mouth daily.    Provider, Historical, Anderson  Flaxseed, Linseed, (FLAXSEED OIL PO) Take 1 capsule by mouth 3 (three) times daily.    Provider, Historical, Anderson  ibuprofen (ADVIL,MOTRIN) 200 MG tablet Take 400 mg by mouth every 6 (six) hours as needed for mild pain.    Provider, Historical, Anderson  meloxicam (MOBIC) 15 MG tablet Take 15 mg by mouth daily. Patient not taking: Reported on 05/15/2020    Provider, Historical, Anderson  metroNIDAZOLE (FLAGYL) 500 MG tablet Take 1 tablet (500 mg total) by mouth 3 (three) times daily. Patient not taking: Reported on 05/15/2020 02/22/15   Eric Rouge III, Anderson  omeprazole (PRILOSEC) 20 MG capsule Take 20 mg by mouth daily.    Provider, Historical, Anderson  ondansetron (ZOFRAN ODT) 4 MG disintegrating tablet Take 1 tablet (4 mg total) by mouth every 8 (eight) hours as needed for nausea or vomiting. 02/25/15   Eric Apley, Anderson  tadalafil (CIALIS) 20 MG tablet Take 20 mg by mouth daily as needed for erectile dysfunction.    Provider, Historical, Anderson  venlafaxine XR (EFFEXOR-XR) 150 MG 24 hr capsule Take 150 mg by mouth daily with breakfast.    Provider, Historical, Anderson     Allergies:   No Known Allergies  Social History:   Social History   Socioeconomic History   Marital status: Married    Spouse name: Not on  file   Number of children: Not on file   Years of education: Not on file   Highest education level: Not on file  Occupational History   Not on file  Tobacco Use   Smoking status: Never   Smokeless tobacco: Never  Vaping Use   Vaping Use: Never used  Substance and Sexual Activity   Alcohol use: Yes    Alcohol/week: 0.0 standard drinks    Comment: occasional   Drug use: No   Sexual activity: Not on file  Other Topics Concern   Not on file  Social History Narrative   Not on file   Social Determinants of Health   Financial Resource Strain: Not on file  Food Insecurity: Not  on file  Transportation Needs: Not on file  Physical Activity: Not on file  Stress: Not on file  Social Connections: Not on file  Intimate Partner Violence: Not on file    Family History:   The patient's family history includes Diabetes in his brother and sister; Heart disease in his father.    ROS:  Please see the history of present illness.  All other ROS reviewed and negative.     Physical Exam/Data:  There were no vitals filed for this visit. No intake or output data in the 24 hours ending 07/12/21 1359 Last 3 Weights 07/12/2021 05/15/2020 03/01/2015  Weight (lbs) 210 lb 205 lb 187 lb  Weight (kg) 95.255 kg 92.987 kg 84.823 kg     There is no height or weight on file to calculate BMI.   Patient was examined by Dr. Tresa Anderson. Physical exam to follow.   EKG:  The ECG that was done  was personally reviewed and demonstrates sinus rhythm with HR 66, STE inferior leads, V3-6, ST depression AVL and V2  Relevant CV Studies:  Heart cath in progress  Echo pending  Laboratory Data:  High Sensitivity Troponin:  No results for input(s): TROPONINIHS in the last 720 hours.    ChemistryNo results for input(s): NA, K, CL, CO2, GLUCOSE, BUN, CREATININE, CALCIUM, MG, GFRNONAA, GFRAA, ANIONGAP in the last 168 hours.  No results for input(s): PROT, ALBUMIN, AST, ALT, ALKPHOS, BILITOT in the last 168 hours. Lipids No results for input(s): CHOL, TRIG, HDL, LABVLDL, LDLCALC, CHOLHDL in the last 168 hours. Hematology Recent Labs  Lab 07/12/21 1326  WBC 12.5*  RBC 5.14  HGB 14.1  HCT 42.1  MCV 81.9  MCH 27.4  MCHC 33.5  RDW 12.7  PLT 337   Thyroid No results for input(s): TSH, FREET4 in the last 168 hours. BNPNo results for input(s): BNP, PROBNP in the last 168 hours.  DDimer No results for input(s): DDIMER in the last 168 hours.   Radiology/Studies:  DG Chest Port 1 View  Result Date: 07/12/2021 CLINICAL DATA:  Chest pain, diaphoresis EXAM: PORTABLE CHEST 1 VIEW COMPARISON:   07/12/2021 FINDINGS: Mild bilateral interstitial thickening. No focal consolidation. No pleural effusion or pneumothorax. Heart and mediastinal contours are unremarkable. No acute osseous abnormality. IMPRESSION: 1. Bilateral interstitial thickening concerning for mild interstitial edema. Electronically Signed   By: Elige Ko M.D.   On: 07/12/2021 12:29     Assessment and Plan:   STEMI EKG concerning with STE inferior and anterolateral leads. Heart cath in progress, results to follow.    Hyperlipidemia  Will collect lipid panel tomorrow morning. Started on 80 mg lipitor.    GERD On prilosec    Risk Assessment/Risk Scores:    TIMI Risk Score for ST  Elevation MI:   The patient's TIMI risk score is 2, which indicates a 2.2% risk of all cause mortality at 30 days.       Severity of Illness: The appropriate patient status for this patient is INPATIENT. Inpatient status is judged to be reasonable and necessary in order to provide the required intensity of service to ensure the patient's safety. The patient's presenting symptoms, physical exam findings, and initial radiographic and laboratory data in the context of their chronic comorbidities is felt to place them at high risk for further clinical deterioration. Furthermore, it is not anticipated that the patient will be medically stable for discharge from the hospital within 2 midnights of admission.   * I certify that at the point of admission it is my clinical judgment that the patient will require inpatient hospital care spanning beyond 2 midnights from the point of admission due to high intensity of service, high risk for further deterioration and high frequency of surveillance required.*   For questions or updates, please contact [facility] Please consult [email or web address] for contact info under     Signed, Marcelino Dusterngela Nicole Duke, [place]  07/12/2021 1:59 PM   Patient seen and examined. Agree with assessment and plan.  Mr. Penni HomansKenneth  Group is a 61 year old male without known prior history of cardiac disease.  He has a strong family history for CAD.  He has not been on any cardiac medications.  This morning while out shopping with his wife, he began to notice substernal chest pressure and fullness.  His chest pain persisted.  [facility] presented [facility] room where ECG showed inferolateral ST elevation.  A code STEMI was activated.  However, the catheterization laboratory at [facility] was currently occupied and as result the patient was transferred [facility] to undergo emergent catheterization with probable coronary intervention.  On arrival to code he was still having significant substernal chest pain.  His ECG showed 3 mm of ST elevation in the inferior leads as well as lateral leads with ST depression in leads I and L and precordial lead.  On exam rhythm is regular initial blood pressure stable.  HEENT was unremarkable.  There is no carotid bruits.  Lungs were clear.  Rhythm was regular with faint 1/6 systolic murmur.  Abdomen was soft nontender.  Femoral pulses were 2+ as a radial pulses.  There was no edema.  I discussed the need for emergent cardiac catheterization with the patient.  He is aware of risk benefits of the procedure.   Lennette Biharihomas A. Desmon Hitchner, Anderson, [facility]ACC 07/12/2021 3:34 PM

## 2021-07-12 NOTE — Progress Notes (Signed)
Chaplain responded to STEMI.  Per RN and Financial controller no family is present. Pt not available.  Please call as needed for support.  Minus Liberty, [place] Pager:  [phone]    07/12/21 1300  Clinical Encounter Type  Visited With Patient not available  Visit Type Initial;Critical Care  Referral From Physician  Consult/Referral To Chaplain  Stress Factors  Patient Stress Factors Health changes

## 2021-07-12 NOTE — ED Notes (Signed)
Called [facility] for transport [phone]

## 2021-07-12 NOTE — Plan of Care (Signed)

## 2021-07-12 NOTE — Progress Notes (Signed)
Vasopressor was not infusing at this time and patient's RN stated that levophed stopped at 1515. Patient doesn't need [facility] PIV access at this time. Patient's RN will put on the order, if levophed restart. HS [employer]

## 2021-07-12 NOTE — ED Provider Notes (Signed)
[facility] Provider Note    Event Date/Time   First MD Initiated Contact with Patient 07/12/21 1210     (approximate)   History   Chest Pain   HPI  Eric Anderson is a 61 y.o. male who presents with chest tightness and diaphoresis.  His wife reports that they were shopping, started to feel patient started to feel chest tightness and became sweaty and then felt a little lightheaded so they came to the emergency department.     Physical Exam   Triage Vital Signs: ED Triage Vitals  Enc Vitals Group     BP 07/12/21 1207 118/61     Pulse Rate 07/12/21 1207 74     Resp 07/12/21 1207 (!) 26     Temp --      Temp src --      SpO2 07/12/21 1207 100 %     Weight 07/12/21 1206 95.3 kg (210 lb)     Height 07/12/21 1206 1.753 m (5\' 9" )     Head Circumference --      Peak Flow --      Pain Score 07/12/21 1206 10     Pain Loc --      Pain Edu? --      Excl. in [place]? --     Most recent vital signs: Vitals:   07/12/21 1207 07/12/21 1215  BP: 118/61 125/80  Pulse: 74 67  Resp: (!) 26 (!) 25  SpO2: 100% 100%     General: Awake, pale CV:  Good peripheral perfusion.  Regular rate and rhythm Resp:  Normal effort.  Abd:  No distention.  Other:  No calf pain or swelling   ED Results / Procedures / Treatments   Labs (all labs ordered are listed, but only abnormal results are displayed) Labs Reviewed  RESP PANEL BY RT-PCR (FLU A&B, COVID) ARPGX2  HEMOGLOBIN A1C  CBC WITH DIFFERENTIAL/PLATELET  PROTIME-INR  APTT  COMPREHENSIVE METABOLIC PANEL  LIPID PANEL  TROPONIN I (HIGH SENSITIVITY)     EKG  ED ECG REPORT I, Lavonia Drafts, the attending physician, personally viewed and interpreted this ECG.  Date: 07/12/2021  Rhythm: normal sinus rhythm QRS Axis: normal Intervals: normal ST/T Wave abnormalities: ST elevation consistent with inferior infarct Narrative Interpretation: STEMI    RADIOLOGY     PROCEDURES:  Critical Care  performed: yes  CRITICAL CARE Performed by: Lavonia Drafts   Total critical care time: 20 minutes  Critical care time was exclusive of separately billable procedures and treating other patients.  Critical care was necessary to treat or prevent imminent or life-threatening deterioration.  Critical care was time spent personally by me on the following activities: development of treatment plan with patient and/or surrogate as well as nursing, discussions with consultants, evaluation of patient's response to treatment, examination of patient, obtaining history from patient or surrogate, ordering and performing treatments and interventions, ordering and review of laboratory studies, ordering and review of radiographic studies, pulse oximetry and re-evaluation of patient's condition.   Procedures   MEDICATIONS ORDERED IN ED: Medications  0.9 %  sodium chloride infusion ( Intravenous New Bag/Given 07/12/21 1216)  heparin injection 4,000 Units (4,000 Units Intravenous Not Given 07/12/21 1215)  aspirin chewable tablet 324 mg (324 mg Oral Not Given 07/12/21 1215)  aspirin chewable tablet 324 mg (324 mg Oral Given 07/12/21 1214)  heparin injection 4,000 Units (4,000 Units Intravenous Given 07/12/21 1215)     IMPRESSION / MDM / ASSESSMENT AND  PLAN / ED COURSE  I reviewed the triage vital signs and the nursing notes.  Patient presents with chest pain as above, triage EKG consistent with STEMI, code STEMI activated immediately.  Dr. Corky Sox here in the emergency department for separate STEMI patient, recommends transfer to [facility] given that he has a patient on the table  Heparin aspirin given, patient's blood pressure is stable.  We will hold on nitroglycerin  IV fluids infusing.  Discussed with Dr. Claiborne Billings of cardiology at [facility], he accepts the patient, our EMS will transport the patient as a will be the fastest  ----------------------------------------- 12:24 PM on  07/12/2021 ----------------------------------------- EMS has arrived, stable for transport          FINAL CLINICAL IMPRESSION(S) / ED DIAGNOSES   Final diagnoses:  ST elevation myocardial infarction (STEMI), unspecified artery ([place])     Rx / DC Orders   ED Discharge Orders     None        Note:  This document was prepared using Dragon voice recognition software and may include unintentional dictation errors.   Lavonia Drafts, MD 07/12/21 1224

## 2021-07-12 NOTE — ED Notes (Signed)
Called [facility] activated stemi [phone]

## 2021-07-12 NOTE — ED Notes (Signed)
Multiple attempts to speak with cath lab at [facility] [phone]

## 2021-07-13 ENCOUNTER — Inpatient Hospital Stay (HOSPITAL_COMMUNITY): Payer: Managed Care, Other (non HMO)

## 2021-07-13 DIAGNOSIS — I2111 ST elevation (STEMI) myocardial infarction involving right coronary artery: Secondary | ICD-10-CM | POA: Diagnosis not present

## 2021-07-13 LAB — CBC
HCT: 36.5 % — ABNORMAL LOW (ref 39.0–52.0)
Hemoglobin: 11.8 g/dL — ABNORMAL LOW (ref 13.0–17.0)
MCH: 26.9 pg (ref 26.0–34.0)
MCHC: 32.3 g/dL (ref 30.0–36.0)
MCV: 83.1 fL (ref 80.0–100.0)
Platelets: 306 10*3/uL (ref 150–400)
RBC: 4.39 MIL/uL (ref 4.22–5.81)
RDW: 13 % (ref 11.5–15.5)
WBC: 13.4 10*3/uL — ABNORMAL HIGH (ref 4.0–10.5)
nRBC: 0 % (ref 0.0–0.2)

## 2021-07-13 LAB — ECHOCARDIOGRAM COMPLETE
AR max vel: 2.37 cm2
AV Area VTI: 2.13 cm2
AV Area mean vel: 2.18 cm2
AV Mean grad: 4 mmHg
AV Peak grad: 7.8 mmHg
Ao pk vel: 1.4 m/s
Area-P 1/2: 3.74 cm2
Height: 69 in
MV VTI: 1.78 cm2
S' Lateral: 3.2 cm
Weight: 3358.05 oz

## 2021-07-13 LAB — BASIC METABOLIC PANEL
Anion gap: 9 (ref 5–15)
BUN: 10 mg/dL (ref 6–20)
CO2: 21 mmol/L — ABNORMAL LOW (ref 22–32)
Calcium: 8.2 mg/dL — ABNORMAL LOW (ref 8.9–10.3)
Chloride: 106 mmol/L (ref 98–111)
Creatinine, Ser: 0.83 mg/dL (ref 0.61–1.24)
GFR, Estimated: >60 mL/min (ref >60)
Glucose, Bld: 133 mg/dL — ABNORMAL HIGH (ref 70–99)
Potassium: 3.9 mmol/L (ref 3.5–5.1)
Sodium: 136 mmol/L (ref 135–145)

## 2021-07-13 LAB — SURGICAL PCR SCREEN
MRSA, PCR: NEGATIVE
Staphylococcus aureus: NEGATIVE

## 2021-07-13 LAB — TROPONIN I (HIGH SENSITIVITY): Troponin I (High Sensitivity): >24000 ng/L (ref <18)

## 2021-07-13 MED ORDER — POTASSIUM CHLORIDE CRYS ER 20 MEQ PO TBCR
20.0000 meq | EXTENDED_RELEASE_TABLET | Freq: Once | ORAL | Status: AC
  Administered 2021-07-13: 20 meq via ORAL
  Filled 2021-07-13: qty 1

## 2021-07-13 MED ORDER — VENLAFAXINE HCL ER 150 MG PO CP24
150.0000 mg | ORAL_CAPSULE | Freq: Every day | ORAL | Status: DC
  Administered 2021-07-15: 150 mg via ORAL
  Filled 2021-07-13 (×2): qty 1

## 2021-07-13 MED ORDER — FINASTERIDE 5 MG PO TABS
5.0000 mg | ORAL_TABLET | Freq: Every day | ORAL | Status: DC
  Administered 2021-07-15: 5 mg via ORAL
  Filled 2021-07-13 (×2): qty 1

## 2021-07-13 MED ORDER — VENLAFAXINE HCL ER 150 MG PO CP24: 150.0000 mg | ORAL_CAPSULE | Freq: Once | ORAL | Status: DC

## 2021-07-13 MED ORDER — PANTOPRAZOLE SODIUM 40 MG PO TBEC
40.0000 mg | DELAYED_RELEASE_TABLET | Freq: Every day | ORAL | Status: DC
  Administered 2021-07-15: 40 mg via ORAL
  Filled 2021-07-13 (×2): qty 1

## 2021-07-13 MED ORDER — METOPROLOL TARTRATE 12.5 MG HALF TABLET
12.5000 mg | ORAL_TABLET | Freq: Two times a day (BID) | ORAL | Status: DC
  Administered 2021-07-13 – 2021-07-15 (×4): 12.5 mg via ORAL
  Filled 2021-07-13 (×4): qty 1

## 2021-07-13 NOTE — Progress Notes (Signed)
°  Echocardiogram 2D Echocardiogram has been performed.  Gerda Diss 07/13/2021, 10:24 AM

## 2021-07-13 NOTE — Progress Notes (Signed)
Progress Note  Patient Name: Eric Anderson Date of Encounter: 07/13/2021  Primary Cardiologist: [name], MD  Subjective   Had trouble sleeping, just could not get comfortable.  No chest pain or breathlessness this morning, sitting in bedside chair.  Inpatient Medications    Scheduled Meds:  aspirin  81 mg Oral Daily   atorvastatin  80 mg Oral Daily   Chlorhexidine Gluconate Cloth  6 each Topical Daily   enoxaparin (LOVENOX) injection  40 mg Subcutaneous Q24H   sodium chloride flush  3 mL Intravenous Q12H   ticagrelor  90 mg Oral BID   Continuous Infusions:  sodium chloride Stopped (07/13/21 0442)   sodium chloride     sodium chloride Stopped (07/13/21 0708)   norepinephrine (LEVOPHED) Adult infusion     tirofiban 0.15 mcg/kg/min (07/13/21 0800)   PRN Meds: sodium chloride, acetaminophen, diazepam, ondansetron (ZOFRAN) IV, sodium chloride flush, zolpidem   Vital Signs    Vitals:   07/13/21 0400 07/13/21 0417 07/13/21 0700 07/13/21 0800  BP: 102/70  96/65 114/78  Pulse: 61  80 72  Resp: 20  (!) 27 (!) 23  Temp:  99.3 F (37.4 C) 99 F (37.2 C)   TempSrc:  Oral Oral   SpO2: 98%  98% 99%  Weight:      Height:        Intake/Output Summary (Last 24 hours) at 07/13/2021 0915 Last data filed at 07/13/2021 0800 Gross per 24 hour  Intake 2699.79 ml  Output 1000 ml  Net 1699.79 ml   Filed Weights   07/12/21 2043  Weight: 95.2 kg    Telemetry    Sinus rhythm with burst of NSVT.  Personally reviewed.  ECG    An ECG dated 07/13/2021 was personally reviewed today and demonstrated:  Sinus rhythm with evolutionary changes of recent inferior lateral STEMI, significantly improved but not resolved inferior ST segment elevation.  Physical Exam   GEN: No acute distress.   Neck: No JVD. Cardiac: RRR, no murmur, rub, or gallop.  Respiratory: Nonlabored. Clear to auscultation bilaterally. GI: Soft, nontender, bowel sounds present. MS: No edema; No  deformity. Neuro:  Nonfocal. Psych: Alert and oriented x 3. Normal affect.  Labs    Chemistry Recent Labs  Lab 07/12/21 1326 07/12/21 1327 07/13/21 0030  NA 135 138 136  K 3.6 3.7 3.9  CL 108 105 106  CO2 19*  --  21*  GLUCOSE 144* 146* 133*  BUN 13 14 10   CREATININE 0.82 0.60* 0.83  CALCIUM 8.7*  --  8.2*  PROT 6.6  --   --   ALBUMIN 3.9  --   --   AST 32  --   --   ALT 34  --   --   ALKPHOS 68  --   --   BILITOT 0.8  --   --   GFRNONAA >60  --  >60  ANIONGAP 8  --  9     Hematology Recent Labs  Lab 07/12/21 1326 07/12/21 1327 07/13/21 0030  WBC 12.5*  --  13.4*  RBC 5.14  --  4.39  HGB 14.1 15.3 11.8*  HCT 42.1 45.0 36.5*  MCV 81.9  --  83.1  MCH 27.4  --  26.9  MCHC 33.5  --  32.3  RDW 12.7  --  13.0  PLT 337  --  306    Cardiac Enzymes Recent Labs  Lab 07/12/21 1326 07/13/21 0617  TROPONINIHS 180* >24,000*  Radiology    CARDIAC CATHETERIZATION  Result Date: 07/12/2021   Prox RCA lesion is 100% stenosed.   3rd RPL lesion is 100% stenosed.   2nd RPL lesion is 70% stenosed.   2nd Diag lesion is 75% stenosed.   Mid Cx to Dist Cx lesion is 70% stenosed.   Mid LAD-1 lesion is 80% stenosed with 80% stenosed side branch in 2nd Sept.   Mid LAD-2 lesion is 50% stenosed.   RPDA lesion is 40% stenosed.   A drug-eluting stent was successfully placed.   Post intervention, there is a 0% residual stenosis.   Post intervention, there is a 0% residual stenosis. 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 Multivessel CAD with mild coronary calcification of the LAD at the ostium with 50% proximal stenosis, complex 80 and 75% stenosis of the proximal to mid LAD and ostial diagonal vessel with 50% mid LAD stenosis; 50% proximal stenosis in the ramus intermediate vessel; 70% proximal circumflex stenosis with total occlusion disease (appears chronic) of the mid vessel after small marginal branches with very faint  filling of diminutive small branches distally. LVEDP 12 mmHg. Successful percutaneous coronary intervention of the large dominant RCA with PTCA and stenting of the diffusely diseased proximal RCA with insertion of a 3.5 x 26 mm Medtronic Onyx frontiers stent postdilated to 3.78 mm with the proximal stenosis being reduced to 0%.  There was significant thrombus burden for which Aggrastat was started.  In addition, with initial reperfusion, the patient became bradycardic with junctional rhythm treated with atropine 0.5 mg x 2 and due to hypotension with blood pressure dropping into the 60s received IV fluid bolus and Levophed inotropic therapy. Successful PTCA of the totally occluded PLA branch with restoration of TIMI-3 flow. RECOMMENDATION: DAPT for minimum of 1 year but most likely longer.  Initiate medical therapy for concomitant CAD.  Will ask colleagues to review concerning potential need for LAD intervention following stability.  Suspect the circumflex occlusion is chronic and will treat medically.  Aggressive lipid-lowering therapy with target LDL less than 70.  A 2D echo Doppler study will be obtained in a.m. for assessment of LV function.   DG Chest Port 1 View  Result Date: 07/12/2021 CLINICAL DATA:  Chest pain, diaphoresis EXAM: PORTABLE CHEST 1 VIEW COMPARISON:  07/12/2021 FINDINGS: Mild bilateral interstitial thickening. No focal consolidation. No pleural effusion or pneumothorax. Heart and mediastinal contours are unremarkable. No acute osseous abnormality. IMPRESSION: 1. Bilateral interstitial thickening concerning for mild interstitial edema. Electronically Signed   By: Elige Ko M.D.   On: 07/12/2021 12:29    Cardiac Studies   Echocardiogram pending.  Assessment & Plan    1.  Inferolateral STEMI status post DES to the culprit lesion proximal RCA.  There was significant distal thrombus burden as well.  Also status post angioplasty of occluded PLA with restoration of TIMI-3 flow.   Echocardiogram pending for assessment of LVEF.  2.  Residual CAD including complex 80% proximal to mid LAD disease and 70% proximal circumflex disease with occlusion of the mid vessel.  3.  Mixed hyperlipidemia, LDL 167.  Started on Lipitor 80 mg daily.  Discussed cardiac catheterization findings and intervention with the patient this morning.  Interventional team to review films and determine whether staged left system PCI would be considered versus trial of medical therapy first.  Continue aspirin, Brilinta, and high-dose Lipitor.  Attempt addition of very low-dose beta-blocker with hold parameters, can further  round out regimen gradually and with information from echocardiogram.  Signed, Nona Dell, MD  07/13/2021, 9:15 AM

## 2021-07-13 NOTE — Discharge Instructions (Signed)

## 2021-07-13 NOTE — Significant Event (Signed)
Called to microbiology as surgical PCR collected yesterday and was sent via tube station has not resulted yet. Per lab, they do not have specimen. RN collected a new specimen and dropped it off at the microbiology lab this evening.    Kathaleen Grinder, RN

## 2021-07-13 NOTE — Plan of Care (Signed)
°  Problem: Education: Goal: Understanding of CV disease, CV risk reduction, and recovery process will improve 07/13/2021 2000 by Toni Arthurs, RN Outcome: Progressing 07/13/2021 2000 by Toni Arthurs, RN Outcome: Progressing Goal: Individualized Educational Video(s) 07/13/2021 2000 by Toni Arthurs, RN Outcome: Progressing 07/13/2021 2000 by Toni Arthurs, RN Outcome: Progressing   Problem: Activity: Goal: Ability to return to baseline activity level will improve 07/13/2021 2000 by Toni Arthurs, RN Outcome: Progressing 07/13/2021 2000 by Toni Arthurs, RN Outcome: Progressing   Problem: Cardiovascular: Goal: Ability to achieve and maintain adequate cardiovascular perfusion will improve 07/13/2021 2000 by Toni Arthurs, RN Outcome: Progressing 07/13/2021 2000 by Toni Arthurs, RN Outcome: Progressing Goal: Vascular access site(s) Level 0-1 will be maintained 07/13/2021 2000 by Toni Arthurs, RN Outcome: Progressing 07/13/2021 2000 by Toni Arthurs, RN Outcome: Progressing   Problem: Health Behavior/Discharge Planning: Goal: Ability to safely manage health-related needs after discharge will improve 07/13/2021 2000 by Toni Arthurs, RN Outcome: Progressing 07/13/2021 2000 by Toni Arthurs, RN Outcome: Progressing

## 2021-07-13 NOTE — TOC Progression Note (Signed)
Transition of Care [facility]) - Progression Note    Patient Details  Name: Eric Anderson MRN: [id number] Date of Birth: 1960/07/30  Transition of Care [facility]) CM/SW Contact  Leone Haven, RN Phone Number: 07/13/2021, 8:33 AM  Clinical Narrative:     Transition of Care [facility]) Screening Note   Patient Details  Name: Eric Anderson Date of Birth: [birth year]   Transition of Care [facility]) CM/SW Contact:    Leone Haven, RN Phone Number: 07/13/2021, 8:33 AM    Transition of Care Department [facility]) has reviewed patient and no TOC needs have been identified at this time. We will continue to monitor patient advancement through interdisciplinary progression rounds. If new patient transition needs arise, please place a TOC consult.          Expected Discharge Plan and Services                                                 Social Determinants of Health (SDOH) Interventions    Readmission Risk Interventions No flowsheet data found.

## 2021-07-14 ENCOUNTER — Encounter (HOSPITAL_COMMUNITY): Payer: Self-pay | Admitting: Cardiovascular Disease

## 2021-07-14 ENCOUNTER — Encounter (HOSPITAL_COMMUNITY)
Admission: RE | Disposition: A | Discharge status: Home / Self Care | Payer: Self-pay | Source: Home / Self Care | Attending: Cardiovascular Disease

## 2021-07-14 ENCOUNTER — Other Ambulatory Visit (HOSPITAL_COMMUNITY): Payer: Self-pay

## 2021-07-14 DIAGNOSIS — I2510 Atherosclerotic heart disease of native coronary artery without angina pectoris: Secondary | ICD-10-CM

## 2021-07-14 DIAGNOSIS — I2111 ST elevation (STEMI) myocardial infarction involving right coronary artery: Secondary | ICD-10-CM | POA: Diagnosis not present

## 2021-07-14 DIAGNOSIS — E782 Mixed hyperlipidemia: Secondary | ICD-10-CM | POA: Diagnosis not present

## 2021-07-14 HISTORY — PX: LEFT HEART CATH: CATH118248

## 2021-07-14 HISTORY — PX: CORONARY STENT INTERVENTION: CATH118234

## 2021-07-14 LAB — CBC
HCT: 37.1 % — ABNORMAL LOW (ref 39.0–52.0)
Hemoglobin: 12.4 g/dL — ABNORMAL LOW (ref 13.0–17.0)
MCH: 27.9 pg (ref 26.0–34.0)
MCHC: 33.4 g/dL (ref 30.0–36.0)
MCV: 83.6 fL (ref 80.0–100.0)
Platelets: 282 10*3/uL (ref 150–400)
RBC: 4.44 MIL/uL (ref 4.22–5.81)
RDW: 13.2 % (ref 11.5–15.5)
WBC: 14 10*3/uL — ABNORMAL HIGH (ref 4.0–10.5)
nRBC: 0 % (ref 0.0–0.2)

## 2021-07-14 LAB — POCT ACTIVATED CLOTTING TIME
Activated Clotting Time: 239 seconds
Activated Clotting Time: 432 seconds
Activated Clotting Time: 257 seconds
Activated Clotting Time: 299 seconds
Activated Clotting Time: 317 seconds
Activated Clotting Time: 365 seconds
Activated Clotting Time: 366 seconds

## 2021-07-14 LAB — BASIC METABOLIC PANEL
Anion gap: 8 (ref 5–15)
BUN: 10 mg/dL (ref 6–20)
CO2: 21 mmol/L — ABNORMAL LOW (ref 22–32)
Calcium: 8.5 mg/dL — ABNORMAL LOW (ref 8.9–10.3)
Chloride: 107 mmol/L (ref 98–111)
Creatinine, Ser: 0.81 mg/dL (ref 0.61–1.24)
GFR, Estimated: >60 mL/min (ref >60)
Glucose, Bld: 122 mg/dL — ABNORMAL HIGH (ref 70–99)
Potassium: 3.9 mmol/L (ref 3.5–5.1)
Sodium: 136 mmol/L (ref 135–145)

## 2021-07-14 SURGERY — CORONARY STENT INTERVENTION
Anesthesia: LOCAL

## 2021-07-14 MED ORDER — LIDOCAINE HCL (PF) 1 % IJ SOLN
INTRAMUSCULAR | Status: AC
  Filled 2021-07-14: qty 30

## 2021-07-14 MED ORDER — IOHEXOL 350 MG/ML SOLN
INTRAVENOUS | Status: DC | PRN
  Administered 2021-07-14: 200 mL via INTRA_ARTERIAL

## 2021-07-14 MED ORDER — TICAGRELOR 90 MG PO TABS
90.0000 mg | ORAL_TABLET | Freq: Two times a day (BID) | ORAL | Status: DC
  Administered 2021-07-14 – 2021-07-15 (×2): 90 mg via ORAL
  Filled 2021-07-14 (×2): qty 1

## 2021-07-14 MED ORDER — ENOXAPARIN SODIUM 40 MG/0.4ML IJ SOSY
40.0000 mg | PREFILLED_SYRINGE | INTRAMUSCULAR | Status: DC
  Administered 2021-07-15: 40 mg via SUBCUTANEOUS
  Filled 2021-07-14: qty 0.4

## 2021-07-14 MED ORDER — MIDAZOLAM HCL 2 MG/2ML IJ SOLN
INTRAMUSCULAR | Status: AC
  Filled 2021-07-14: qty 2

## 2021-07-14 MED ORDER — HEPARIN (PORCINE) IN NACL 1000-0.9 UT/500ML-% IV SOLN
INTRAVENOUS | Status: DC | PRN
  Administered 2021-07-14 (×3): 500 mL

## 2021-07-14 MED ORDER — FENTANYL CITRATE (PF) 100 MCG/2ML IJ SOLN
INTRAMUSCULAR | Status: DC | PRN
  Administered 2021-07-14 (×2): 25 ug via INTRAVENOUS

## 2021-07-14 MED ORDER — ASPIRIN 81 MG PO CHEW
81.0000 mg | CHEWABLE_TABLET | Freq: Every day | ORAL | Status: DC
  Administered 2021-07-15: 81 mg via ORAL
  Filled 2021-07-14: qty 1

## 2021-07-14 MED ORDER — SODIUM CHLORIDE 0.9% FLUSH: 3.0000 mL | INTRAVENOUS | Status: DC | PRN

## 2021-07-14 MED ORDER — HEPARIN SODIUM (PORCINE) 1000 UNIT/ML IJ SOLN
INTRAMUSCULAR | Status: DC | PRN
  Administered 2021-07-14: 2000 [IU] via INTRAVENOUS
  Administered 2021-07-14: 3000 [IU] via INTRAVENOUS
  Administered 2021-07-14: 9500 [IU] via INTRAVENOUS

## 2021-07-14 MED ORDER — SODIUM CHLORIDE 0.9 % IV SOLN: 250.0000 mL | INTRAVENOUS | Status: DC | PRN

## 2021-07-14 MED ORDER — FENTANYL CITRATE (PF) 100 MCG/2ML IJ SOLN
INTRAMUSCULAR | Status: AC
  Filled 2021-07-14: qty 2

## 2021-07-14 MED ORDER — VERAPAMIL HCL 2.5 MG/ML IV SOLN
INTRAVENOUS | Status: AC
  Filled 2021-07-14: qty 2

## 2021-07-14 MED ORDER — SODIUM CHLORIDE 0.9% FLUSH
3.0000 mL | Freq: Two times a day (BID) | INTRAVENOUS | Status: DC
  Administered 2021-07-14: 3 mL via INTRAVENOUS

## 2021-07-14 MED ORDER — LIDOCAINE HCL (PF) 1 % IJ SOLN
INTRAMUSCULAR | Status: DC | PRN
  Administered 2021-07-14: 2 mL

## 2021-07-14 MED ORDER — LABETALOL HCL 5 MG/ML IV SOLN: 10.0000 mg | INTRAVENOUS | Status: AC | PRN

## 2021-07-14 MED ORDER — ONDANSETRON HCL 4 MG/2ML IJ SOLN: 4.0000 mg | Freq: Four times a day (QID) | INTRAMUSCULAR | Status: DC | PRN

## 2021-07-14 MED ORDER — HEPARIN SODIUM (PORCINE) 1000 UNIT/ML IJ SOLN
INTRAMUSCULAR | Status: AC
  Filled 2021-07-14: qty 10

## 2021-07-14 MED ORDER — ATORVASTATIN CALCIUM 80 MG PO TABS
80.0000 mg | ORAL_TABLET | Freq: Every day | ORAL | Status: DC
  Administered 2021-07-15: 80 mg via ORAL
  Filled 2021-07-14: qty 1

## 2021-07-14 MED ORDER — NITROGLYCERIN 1 MG/10 ML FOR IR/CATH LAB
INTRA_ARTERIAL | Status: DC | PRN
  Administered 2021-07-14 (×3): 100 ug via INTRACORONARY
  Administered 2021-07-14: 200 ug via INTRACORONARY

## 2021-07-14 MED ORDER — NITROGLYCERIN 1 MG/10 ML FOR IR/CATH LAB
INTRA_ARTERIAL | Status: AC
  Filled 2021-07-14: qty 10

## 2021-07-14 MED ORDER — DOPAMINE-DEXTROSE 3.2-5 MG/ML-% IV SOLN
INTRAVENOUS | Status: AC
  Filled 2021-07-14: qty 250

## 2021-07-14 MED ORDER — SODIUM CHLORIDE 0.9% FLUSH
3.0000 mL | Freq: Two times a day (BID) | INTRAVENOUS | Status: DC
  Administered 2021-07-14 – 2021-07-15 (×2): 3 mL via INTRAVENOUS

## 2021-07-14 MED ORDER — ACETAMINOPHEN 325 MG PO TABS
650.0000 mg | ORAL_TABLET | ORAL | Status: DC | PRN
  Administered 2021-07-14: 650 mg via ORAL
  Filled 2021-07-14: qty 2

## 2021-07-14 MED ORDER — VERAPAMIL HCL 2.5 MG/ML IV SOLN
INTRAVENOUS | Status: DC | PRN
  Administered 2021-07-14: 10 mL via INTRA_ARTERIAL

## 2021-07-14 MED ORDER — ISOSORBIDE MONONITRATE ER 30 MG PO TB24
15.0000 mg | ORAL_TABLET | Freq: Every day | ORAL | Status: DC
  Administered 2021-07-14 – 2021-07-15 (×2): 15 mg via ORAL
  Filled 2021-07-14 (×3): qty 1

## 2021-07-14 MED ORDER — HEPARIN (PORCINE) IN NACL 1000-0.9 UT/500ML-% IV SOLN
INTRAVENOUS | Status: AC
  Filled 2021-07-14: qty 1000

## 2021-07-14 MED ORDER — SODIUM CHLORIDE 0.9 % IV SOLN: INTRAVENOUS | Status: DC

## 2021-07-14 MED ORDER — ATROPINE SULFATE 1 MG/10ML IJ SOSY
PREFILLED_SYRINGE | INTRAMUSCULAR | Status: AC
  Filled 2021-07-14: qty 20

## 2021-07-14 MED ORDER — EPINEPHRINE 1 MG/10ML IJ SOSY
PREFILLED_SYRINGE | INTRAMUSCULAR | Status: AC
  Filled 2021-07-14: qty 20

## 2021-07-14 MED ORDER — MIDAZOLAM HCL 2 MG/2ML IJ SOLN
INTRAMUSCULAR | Status: DC | PRN
  Administered 2021-07-14: 1 mg via INTRAVENOUS
  Administered 2021-07-14: 2 mg via INTRAVENOUS

## 2021-07-14 MED ORDER — HYDRALAZINE HCL 20 MG/ML IJ SOLN: 10.0000 mg | INTRAMUSCULAR | Status: AC | PRN

## 2021-07-14 SURGICAL SUPPLY — 23 items
BALLN SAPPHIRE 2.0X10 (BALLOONS) ×2
BALLN SAPPHIRE 2.0X15 (BALLOONS) ×2
BALLN SAPPHIRE ~~LOC~~ 2.75X15 (BALLOONS) ×1 IMPLANT
BALLN SCOREFLEX 2.50X10 (BALLOONS) ×2
BALLOON SAPPHIRE 2.0X10 (BALLOONS) IMPLANT
BALLOON SAPPHIRE 2.0X15 (BALLOONS) IMPLANT
BALLOON SCOREFLEX 2.50X10 (BALLOONS) IMPLANT
CATH VISTA GUIDE 6FR XB3.5 (CATHETERS) ×1 IMPLANT
CATH VISTA GUIDE 6FR XBLAD3.5 (CATHETERS) ×1 IMPLANT
DEVICE RAD COMP TR BAND LRG (VASCULAR PRODUCTS) ×1 IMPLANT
ELECT DEFIB PAD ADLT CADENCE (PAD) ×1 IMPLANT
GLIDESHEATH SLEND SS 6F .021 (SHEATH) ×1 IMPLANT
GUIDEWIRE INQWIRE 1.5J.035X260 (WIRE) IMPLANT
INQWIRE 1.5J .035X260CM (WIRE) ×2
KIT ENCORE 26 ADVANTAGE (KITS) ×1 IMPLANT
KIT HEART LEFT (KITS) ×2 IMPLANT
PACK CARDIAC CATHETERIZATION (CUSTOM PROCEDURE TRAY) ×2 IMPLANT
SHEATH PROBE COVER 6X72 (BAG) ×1 IMPLANT
STENT ONYX FRONTIER 2.5X34 (Permanent Stent) ×1 IMPLANT
TRANSDUCER W/STOPCOCK (MISCELLANEOUS) ×2 IMPLANT
TUBING CIL FLEX 10 FLL-RA (TUBING) ×2 IMPLANT
WIRE COUGAR XT STRL 190CM (WIRE) ×1 IMPLANT
WIRE HI TORQ WHISPER MS 190CM (WIRE) ×1 IMPLANT

## 2021-07-14 NOTE — TOC Benefit Eligibility Note (Signed)
Patient Teacher, English as a foreign language completed.    The patient is currently admitted and upon discharge could be taking Brilinta 90 mg.  The current 30 day co-pay is, $60.00.   The patient is insured through [place] Patient Advocate Specialist [place] Patient Advocate Team Direct Number: [phone]  Fax: [phone]

## 2021-07-14 NOTE — Progress Notes (Signed)
CARDIAC REHAB PHASE I   MI/stent education started with pt. Pt educated on importance of ASA, Brilinta, statin, and NTG. Pt given MI book and heart healthy diet. Reviewed site care and restrictions. Pt for staged intervention later today. Will f/u and complete education tomorrow.  [id number] Reynold Bowen, RN BSN 07/14/2021 11:50 AM

## 2021-07-14 NOTE — Progress Notes (Addendum)
Progress Note  Patient Name: Eric Anderson Date of Encounter: 07/14/2021  [facility] Cardiologist: [name], MD   Subjective   No chest pain  Inpatient Medications    Scheduled Meds:  aspirin  81 mg Oral Daily   atorvastatin  80 mg Oral Daily   Chlorhexidine Gluconate Cloth  6 each Topical Daily   enoxaparin (LOVENOX) injection  40 mg Subcutaneous Q24H   finasteride  5 mg Oral Daily   metoprolol tartrate  12.5 mg Oral BID   pantoprazole  40 mg Oral Daily   sodium chloride flush  3 mL Intravenous Q12H   ticagrelor  90 mg Oral BID   venlafaxine XR  150 mg Oral Q breakfast   venlafaxine XR  150 mg Oral Once   Continuous Infusions:  sodium chloride Stopped (07/13/21 0442)   sodium chloride Stopped (07/13/21 0929)   sodium chloride Stopped (07/13/21 0708)   PRN Meds: sodium chloride, acetaminophen, diazepam, ondansetron (ZOFRAN) IV, sodium chloride flush, zolpidem   Vital Signs    Vitals:   07/14/21 0500 07/14/21 0600 07/14/21 0700 07/14/21 0735  BP: (!) 106/59 101/73 101/69   Pulse: 66 65 78   Resp: (!) 26 19 (!) 31   Temp:    99.2 F (37.3 C)  TempSrc:    Oral  SpO2: 93% 95% 96%   Weight:      Height:        Intake/Output Summary (Last 24 hours) at 07/14/2021 0840 Last data filed at 07/14/2021 0400 Gross per 24 hour  Intake 367.15 ml  Output --  Net 367.15 ml   Last 3 Weights 07/12/2021 07/12/2021 05/15/2020  Weight (lbs) 209 lb 14.1 oz 210 lb 205 lb  Weight (kg) 95.2 kg 95.255 kg 92.987 kg      Telemetry    NSR, PVC - Personally Reviewed  ECG    NSR, inferior TWI - Personally Reviewed  Physical Exam   GEN: No acute distress.   Neck: No JVD Cardiac: RRR, no murmurs, rubs, or gallops.  Respiratory: Clear to auscultation bilaterally. GI: Soft, nontender, non-distended  MS: No edema; No deformity.  2+ right radial pulse. No hematoma Neuro:  Nonfocal  Psych: Normal affect   Labs    High Sensitivity Troponin:   Recent Labs  Lab  07/12/21 1326 07/13/21 0617  TROPONINIHS 180* >24,000*     Chemistry Recent Labs  Lab 07/12/21 1326 07/12/21 1327 07/13/21 0030 07/14/21 0239  NA 135 138 136 136  K 3.6 3.7 3.9 3.9  CL 108 105 106 107  CO2 19*  --  21* 21*  GLUCOSE 144* 146* 133* 122*  BUN 13 14 10 10   CREATININE 0.82 0.60* 0.83 0.81  CALCIUM 8.7*  --  8.2* 8.5*  PROT 6.6  --   --   --   ALBUMIN 3.9  --   --   --   AST 32  --   --   --   ALT 34  --   --   --   ALKPHOS 68  --   --   --   BILITOT 0.8  --   --   --   GFRNONAA >60  --  >60 >60  ANIONGAP 8  --  9 8    Lipids  Recent Labs  Lab 07/12/21 1326  CHOL 223*  TRIG 90  HDL 38*  LDLCALC 167*  CHOLHDL 5.9    Hematology Recent Labs  Lab 07/12/21 1326 07/12/21 1327 07/13/21  0030 07/14/21 °0239  °WBC 12.5*  --  13.4* 14.0*  °RBC 5.14  --  4.39 4.44  °HGB 14.1 15.3 11.8* 12.4*  °HCT 42.1 45.0 36.5* 37.1*  °MCV 81.9  --  83.1 83.6  °MCH 27.4  --  26.9 27.9  °MCHC 33.5  --  32.3 33.4  °RDW 12.7  --  13.0 13.2  °PLT 337  --  306 282  ° °Thyroid No results for input(s): TSH, FREET4 in the last 168 hours.  °BNPNo results for input(s): BNP, PROBNP in the last 168 hours.  °DDimer No results for input(s): DDIMER in the last 168 hours.  ° °Radiology  °  °CARDIAC CATHETERIZATION ° °Result Date: 07/12/2021 °  Prox RCA lesion is 100% stenosed.   3rd RPL lesion is 100% stenosed.   2nd RPL lesion is 70% stenosed.   2nd Diag lesion is 75% stenosed.   Mid Cx to Dist Cx lesion is 70% stenosed.   Mid LAD-1 lesion is 80% stenosed with 80% stenosed side branch in 2nd Sept.   Mid LAD-2 lesion is 50% stenosed.   RPDA lesion is 40% stenosed.   A drug-eluting stent was successfully placed.   Post intervention, there is a 0% residual stenosis.   Post intervention, there is a 0% residual stenosis. 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 LVEDP 12 mmHg. Successful percutaneous coronary intervention of the large dominant RCA with PTCA and stenting of the diffusely diseased proximal RCA with insertion of a 3.5 x 26 mm Medtronic Onyx frontiers stent postdilated to 3.78 mm with the proximal stenosis being reduced to 0%.  There was significant thrombus burden for which Aggrastat was started.  In addition, with initial reperfusion, the patient became bradycardic with junctional rhythm treated with atropine 0.5 mg x 2 and due to hypotension with blood pressure dropping into the 60s received IV fluid bolus and Levophed inotropic therapy. Successful PTCA of the totally occluded PLA branch with restoration of TIMI-3 flow. RECOMMENDATION: DAPT for minimum of 1 year but most likely longer.  Initiate medical therapy for concomitant CAD.  Will ask colleagues to review concerning potential need for LAD intervention following stability.  Suspect the circumflex occlusion is chronic and will treat medically.  Aggressive lipid-lowering therapy with target LDL less than 70.  A 2D echo Doppler study will be obtained in a.m. for assessment of LV function.  ° °DG Chest Port 1 View ° °Result Date: 07/12/2021 °CLINICAL DATA:  Chest pain, diaphoresis EXAM: PORTABLE CHEST 1 VIEW COMPARISON:  07/12/2021 FINDINGS: Mild bilateral interstitial thickening. No focal consolidation. No pleural effusion or pneumothorax. Heart and mediastinal contours are unremarkable. No acute osseous abnormality. IMPRESSION: 1. Bilateral interstitial thickening concerning for mild interstitial edema. Electronically Signed   By: Hetal   Patel M.D.   On: 07/12/2021 12:29  ° °ECHOCARDIOGRAM COMPLETE ° °Result Date: 07/13/2021 °   ECHOCARDIOGRAM REPORT   Patient Name:   Eric Anderson Date of Exam: 07/13/2021 Medical Rec #:  [id number]           Height:       69.0 in Accession #:    [id number]        Weight:       209.9 lb Date of Birth:  1960/10/25          BSA:          2.109 m Patient Age:    60 years          BP:           114/78 mmHg Patient Gender: M                 HR:           61 bpm. Exam Location:  Inpatient Procedure: 2D Echo, Cardiac Doppler and Color Doppler Indications:    Acute myocardial infarction  History:        Patient has no prior history of Echocardiogram examinations.                 Risk Factors:Dyslipidemia. S/P PCI.  Sonographer:    Ross Ludwig RDCS (AE) Referring Phys: [phone] THOMAS A KELLY IMPRESSIONS  1. Mid / basal inferior hypokinesis . Left ventricular ejection fraction, by estimation, is 50 to 55%. The left ventricle has low normal function. The left ventricle demonstrates regional wall motion abnormalities (see scoring diagram/findings for description). There is moderate left ventricular hypertrophy. Left ventricular diastolic parameters were normal.  2. ? RV infarction . Right ventricular systolic function is moderately reduced. The right ventricular size is moderately enlarged.  3. Right atrial size was mildly dilated.  4. The mitral valve is abnormal. Trivial mitral valve regurgitation. No evidence of mitral stenosis.  5. The aortic valve is tricuspid. There is mild calcification of the aortic valve. Aortic valve regurgitation is not visualized. Aortic valve sclerosis is present, with no evidence of aortic valve stenosis.  6. The inferior vena cava is dilated in size with >50% respiratory variability, suggesting right atrial pressure of 8 mmHg. FINDINGS  Left Ventricle: Mid / basal inferior hypokinesis. Left ventricular ejection fraction, by estimation, is 50 to 55%. The left ventricle has low normal function. The left  ventricle demonstrates regional wall motion abnormalities. The left ventricular internal cavity size was normal in size. There is moderate left ventricular hypertrophy. Left ventricular diastolic parameters were normal. Right Ventricle: ? RV infarction. The right ventricular size is moderately enlarged. No increase in right ventricular wall thickness. Right ventricular systolic function is moderately reduced. Left Atrium: Left atrial size was normal in size. Right Atrium: Right atrial size was mildly dilated. Pericardium: There is no evidence of pericardial effusion. Mitral Valve: The mitral valve is abnormal. There is mild thickening of the mitral valve leaflet(s). There is mild calcification of the mitral valve leaflet(s). Trivial mitral valve regurgitation. No evidence of mitral valve stenosis. MV peak gradient, 4.4 mmHg. The mean mitral valve gradient is 2.0 mmHg. Tricuspid Valve: The tricuspid valve is normal in structure. Tricuspid valve regurgitation is mild . No evidence of tricuspid stenosis. Aortic Valve: The aortic valve is tricuspid. There is mild calcification of the aortic valve. Aortic valve regurgitation is not visualized. Aortic valve sclerosis is present, with no evidence of aortic valve stenosis. Aortic valve mean gradient measures 4.0 mmHg. Aortic valve peak gradient measures 7.8 mmHg. Aortic valve area, by VTI measures 2.13 cm. Pulmonic Valve: The pulmonic valve was normal in structure. Pulmonic valve regurgitation is not visualized. No evidence of pulmonic stenosis. Aorta: The aortic root is normal in size and structure. Venous:  The inferior vena cava is dilated in size with greater than 50% respiratory variability, suggesting right atrial pressure of 8 mmHg. IAS/Shunts: No atrial level shunt detected by color flow Doppler.  LEFT VENTRICLE PLAX 2D LVIDd:         5.10 cm   Diastology LVIDs:         3.20 cm   LV e' medial:    10.30 cm/s LV PW:         1.40 cm   LV E/e' medial:  10.5 LV IVS:         1.50 cm   LV e' lateral:   19.10 cm/s LVOT diam:     1.90 cm   LV E/e' lateral: 5.7 LV SV:         58 LV SV Index:   28 LVOT Area:     2.84 cm  RIGHT VENTRICLE             IVC RV Basal diam:  3.40 cm     IVC diam: 2.30 cm RV S prime:     13.50 cm/s TAPSE (M-mode): 2.2 cm LEFT ATRIUM           Index        RIGHT ATRIUM           Index LA diam:      3.60 cm 1.71 cm/m   RA Area:     13.90 cm LA Vol (A2C): 46.5 ml 22.05 ml/m  RA Volume:   37.30 ml  17.69 ml/m LA Vol (A4C): 32.6 ml 15.46 ml/m  AORTIC VALVE AV Area (Vmax):    2.37 cm AV Area (Vmean):   2.18 cm AV Area (VTI):     2.13 cm AV Vmax:           140.00 cm/s AV Vmean:          94.100 cm/s AV VTI:            0.273 m AV Peak Grad:      7.8 mmHg AV Mean Grad:      4.0 mmHg LVOT Vmax:         117.00 cm/s LVOT Vmean:        72.300 cm/s LVOT VTI:          0.205 m LVOT/AV VTI ratio: 0.75  AORTA Ao Root diam: 3.30 cm Ao Asc diam:  2.90 cm MITRAL VALVE MV Area (PHT): 3.74 cm     SHUNTS MV Area VTI:   1.78 cm     Systemic VTI:  0.20 m MV Peak grad:  4.4 mmHg     Systemic Diam: 1.90 cm MV Mean grad:  2.0 mmHg MV Vmax:       1.05 m/s MV Vmean:      57.4 cm/s MV Decel Time: 203 msec MV E velocity: 108.00 cm/s MV A velocity: 86.50 cm/s MV E/A ratio:  1.25 Charlton Haws MD Electronically signed by Charlton Haws MD Signature Date/Time: 07/13/2021/11:09:22 AM    Final     Cardiac Studies   Cath films personally reviewed. EF 50%  Patient Profile     62 y.o. male with inferior MI.  Feeling well today.  Assessment & Plan    CAD/inferior MI: Hemodynamically stable.  Has residual disease in the LAD at the origin of a diagonal.  Discussed the case with Dr. Tresa Endo.  We will plan for PCI today of the mid LAD.  There is some moderate disease after the bifurcation.  Continue dual antiplatelet therapy.  High-dose atorvastatin.  LDL 167.  Whole food, plant-based diet recommended.  A1c 5.6.  Would not start ACE inhibitor as of yet as his blood pressure is borderline  and he will be getting additional contrast dye for the catheterization today.  For questions or updates, please contact [facility] Please consult [email or web address] for contact info under        Signed, Lance MussJayadeep Anastasija Anfinson, MD  07/14/2021, 8:40 AM

## 2021-07-14 NOTE — H&P (View-Only) (Signed)
Progress Note  Patient Name: Eric Anderson Date of Encounter: 07/14/2021  [facility] Cardiologist: [name], MD   Subjective   No chest pain  Inpatient Medications    Scheduled Meds:  aspirin  81 mg Oral Daily   atorvastatin  80 mg Oral Daily   Chlorhexidine Gluconate Cloth  6 each Topical Daily   enoxaparin (LOVENOX) injection  40 mg Subcutaneous Q24H   finasteride  5 mg Oral Daily   metoprolol tartrate  12.5 mg Oral BID   pantoprazole  40 mg Oral Daily   sodium chloride flush  3 mL Intravenous Q12H   ticagrelor  90 mg Oral BID   venlafaxine XR  150 mg Oral Q breakfast   venlafaxine XR  150 mg Oral Once   Continuous Infusions:  sodium chloride Stopped (07/13/21 0442)   sodium chloride Stopped (07/13/21 0929)   sodium chloride Stopped (07/13/21 0708)   PRN Meds: sodium chloride, acetaminophen, diazepam, ondansetron (ZOFRAN) IV, sodium chloride flush, zolpidem   Vital Signs    Vitals:   07/14/21 0500 07/14/21 0600 07/14/21 0700 07/14/21 0735  BP: (!) 106/59 101/73 101/69   Pulse: 66 65 78   Resp: (!) 26 19 (!) 31   Temp:    99.2 F (37.3 C)  TempSrc:    Oral  SpO2: 93% 95% 96%   Weight:      Height:        Intake/Output Summary (Last 24 hours) at 07/14/2021 0840 Last data filed at 07/14/2021 0400 Gross per 24 hour  Intake 367.15 ml  Output --  Net 367.15 ml   Last 3 Weights 07/12/2021 07/12/2021 05/15/2020  Weight (lbs) 209 lb 14.1 oz 210 lb 205 lb  Weight (kg) 95.2 kg 95.255 kg 92.987 kg      Telemetry    NSR, PVC - Personally Reviewed  ECG    NSR, inferior TWI - Personally Reviewed  Physical Exam   GEN: No acute distress.   Neck: No JVD Cardiac: RRR, no murmurs, rubs, or gallops.  Respiratory: Clear to auscultation bilaterally. GI: Soft, nontender, non-distended  MS: No edema; No deformity.  2+ right radial pulse. No hematoma Neuro:  Nonfocal  Psych: Normal affect   Labs    High Sensitivity Troponin:   Recent Labs  Lab  07/12/21 1326 07/13/21 0617  TROPONINIHS 180* >24,000*     Chemistry Recent Labs  Lab 07/12/21 1326 07/12/21 1327 07/13/21 0030 07/14/21 0239  NA 135 138 136 136  K 3.6 3.7 3.9 3.9  CL 108 105 106 107  CO2 19*  --  21* 21*  GLUCOSE 144* 146* 133* 122*  BUN 13 14 10 10   CREATININE 0.82 0.60* 0.83 0.81  CALCIUM 8.7*  --  8.2* 8.5*  PROT 6.6  --   --   --   ALBUMIN 3.9  --   --   --   AST 32  --   --   --   ALT 34  --   --   --   ALKPHOS 68  --   --   --   BILITOT 0.8  --   --   --   GFRNONAA >60  --  >60 >60  ANIONGAP 8  --  9 8    Lipids  Recent Labs  Lab 07/12/21 1326  CHOL 223*  TRIG 90  HDL 38*  LDLCALC 167*  CHOLHDL 5.9    Hematology Recent Labs  Lab 07/12/21 1326 07/12/21 1327 07/13/21  0030 07/14/21 0239  WBC 12.5*  --  13.4* 14.0*  RBC 5.14  --  4.39 4.44  HGB 14.1 15.3 11.8* 12.4*  HCT 42.1 45.0 36.5* 37.1*  MCV 81.9  --  83.1 83.6  MCH 27.4  --  26.9 27.9  MCHC 33.5  --  32.3 33.4  RDW 12.7  --  13.0 13.2  PLT 337  --  306 282   Thyroid No results for input(s): TSH, FREET4 in the last 168 hours.  BNPNo results for input(s): BNP, PROBNP in the last 168 hours.  DDimer No results for input(s): DDIMER in the last 168 hours.   Radiology    CARDIAC CATHETERIZATION  Result Date: 07/12/2021   Prox RCA lesion is 100% stenosed.   3rd RPL lesion is 100% stenosed.   2nd RPL lesion is 70% stenosed.   2nd Diag lesion is 75% stenosed.   Mid Cx to Dist Cx lesion is 70% stenosed.   Mid LAD-1 lesion is 80% stenosed with 80% stenosed side branch in 2nd Sept.   Mid LAD-2 lesion is 50% stenosed.   RPDA lesion is 40% stenosed.   A drug-eluting stent was successfully placed.   Post intervention, there is a 0% residual stenosis.   Post intervention, there is a 0% residual stenosis. 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 LVEDP 12 mmHg. Successful percutaneous coronary intervention of the large dominant RCA with PTCA and stenting of the diffusely diseased proximal RCA with insertion of a 3.5 x 26 mm Medtronic Onyx frontiers stent postdilated to 3.78 mm with the proximal stenosis being reduced to 0%.  There was significant thrombus burden for which Aggrastat was started.  In addition, with initial reperfusion, the patient became bradycardic with junctional rhythm treated with atropine 0.5 mg x 2 and due to hypotension with blood pressure dropping into the 60s received IV fluid bolus and Levophed inotropic therapy. Successful PTCA of the totally occluded PLA branch with restoration of TIMI-3 flow. RECOMMENDATION: DAPT for minimum of 1 year but most likely longer.  Initiate medical therapy for concomitant CAD.  Will ask colleagues to review concerning potential need for LAD intervention following stability.  Suspect the circumflex occlusion is chronic and will treat medically.  Aggressive lipid-lowering therapy with target LDL less than 70.  A 2D echo Doppler study will be obtained in a.m. for assessment of LV function.   DG Chest Port 1 View  Result Date: 07/12/2021 CLINICAL DATA:  Chest pain, diaphoresis EXAM: PORTABLE CHEST 1 VIEW COMPARISON:  07/12/2021 FINDINGS: Mild bilateral interstitial thickening. No focal consolidation. No pleural effusion or pneumothorax. Heart and mediastinal contours are unremarkable. No acute osseous abnormality. IMPRESSION: 1. Bilateral interstitial thickening concerning for mild interstitial edema. Electronically Signed   By: Elige KoHetal   Patel M.D.   On: 07/12/2021 12:29   ECHOCARDIOGRAM COMPLETE  Result Date: 07/13/2021    ECHOCARDIOGRAM REPORT   Patient Name:   Eric Anderson Date of Exam: 07/13/2021 Medical Rec #:  [id number]  Height:       69.0 in Accession #:    [id number]        Weight:       209.9 lb Date of Birth:  1960/10/25          BSA:          2.109 m Patient Age:    60 years          BP:           114/78 mmHg Patient Gender: M                 HR:           61 bpm. Exam Location:  Inpatient Procedure: 2D Echo, Cardiac Doppler and Color Doppler Indications:    Acute myocardial infarction  History:        Patient has no prior history of Echocardiogram examinations.                 Risk Factors:Dyslipidemia. S/P PCI.  Sonographer:    Ross Ludwig RDCS (AE) Referring Phys: [phone] THOMAS A KELLY IMPRESSIONS  1. Mid / basal inferior hypokinesis . Left ventricular ejection fraction, by estimation, is 50 to 55%. The left ventricle has low normal function. The left ventricle demonstrates regional wall motion abnormalities (see scoring diagram/findings for description). There is moderate left ventricular hypertrophy. Left ventricular diastolic parameters were normal.  2. ? RV infarction . Right ventricular systolic function is moderately reduced. The right ventricular size is moderately enlarged.  3. Right atrial size was mildly dilated.  4. The mitral valve is abnormal. Trivial mitral valve regurgitation. No evidence of mitral stenosis.  5. The aortic valve is tricuspid. There is mild calcification of the aortic valve. Aortic valve regurgitation is not visualized. Aortic valve sclerosis is present, with no evidence of aortic valve stenosis.  6. The inferior vena cava is dilated in size with >50% respiratory variability, suggesting right atrial pressure of 8 mmHg. FINDINGS  Left Ventricle: Mid / basal inferior hypokinesis. Left ventricular ejection fraction, by estimation, is 50 to 55%. The left ventricle has low normal function. The left  ventricle demonstrates regional wall motion abnormalities. The left ventricular internal cavity size was normal in size. There is moderate left ventricular hypertrophy. Left ventricular diastolic parameters were normal. Right Ventricle: ? RV infarction. The right ventricular size is moderately enlarged. No increase in right ventricular wall thickness. Right ventricular systolic function is moderately reduced. Left Atrium: Left atrial size was normal in size. Right Atrium: Right atrial size was mildly dilated. Pericardium: There is no evidence of pericardial effusion. Mitral Valve: The mitral valve is abnormal. There is mild thickening of the mitral valve leaflet(s). There is mild calcification of the mitral valve leaflet(s). Trivial mitral valve regurgitation. No evidence of mitral valve stenosis. MV peak gradient, 4.4 mmHg. The mean mitral valve gradient is 2.0 mmHg. Tricuspid Valve: The tricuspid valve is normal in structure. Tricuspid valve regurgitation is mild . No evidence of tricuspid stenosis. Aortic Valve: The aortic valve is tricuspid. There is mild calcification of the aortic valve. Aortic valve regurgitation is not visualized. Aortic valve sclerosis is present, with no evidence of aortic valve stenosis. Aortic valve mean gradient measures 4.0 mmHg. Aortic valve peak gradient measures 7.8 mmHg. Aortic valve area, by VTI measures 2.13 cm. Pulmonic Valve: The pulmonic valve was normal in structure. Pulmonic valve regurgitation is not visualized. No evidence of pulmonic stenosis. Aorta: The aortic root is normal in size and structure. Venous:  The inferior vena cava is dilated in size with greater than 50% respiratory variability, suggesting right atrial pressure of 8 mmHg. IAS/Shunts: No atrial level shunt detected by color flow Doppler.  LEFT VENTRICLE PLAX 2D LVIDd:         5.10 cm   Diastology LVIDs:         3.20 cm   LV e' medial:    10.30 cm/s LV PW:         1.40 cm   LV E/e' medial:  10.5 LV IVS:         1.50 cm   LV e' lateral:   19.10 cm/s LVOT diam:     1.90 cm   LV E/e' lateral: 5.7 LV SV:         58 LV SV Index:   28 LVOT Area:     2.84 cm  RIGHT VENTRICLE             IVC RV Basal diam:  3.40 cm     IVC diam: 2.30 cm RV S prime:     13.50 cm/s TAPSE (M-mode): 2.2 cm LEFT ATRIUM           Index        RIGHT ATRIUM           Index LA diam:      3.60 cm 1.71 cm/m   RA Area:     13.90 cm LA Vol (A2C): 46.5 ml 22.05 ml/m  RA Volume:   37.30 ml  17.69 ml/m LA Vol (A4C): 32.6 ml 15.46 ml/m  AORTIC VALVE AV Area (Vmax):    2.37 cm AV Area (Vmean):   2.18 cm AV Area (VTI):     2.13 cm AV Vmax:           140.00 cm/s AV Vmean:          94.100 cm/s AV VTI:            0.273 m AV Peak Grad:      7.8 mmHg AV Mean Grad:      4.0 mmHg LVOT Vmax:         117.00 cm/s LVOT Vmean:        72.300 cm/s LVOT VTI:          0.205 m LVOT/AV VTI ratio: 0.75  AORTA Ao Root diam: 3.30 cm Ao Asc diam:  2.90 cm MITRAL VALVE MV Area (PHT): 3.74 cm     SHUNTS MV Area VTI:   1.78 cm     Systemic VTI:  0.20 m MV Peak grad:  4.4 mmHg     Systemic Diam: 1.90 cm MV Mean grad:  2.0 mmHg MV Vmax:       1.05 m/s MV Vmean:      57.4 cm/s MV Decel Time: 203 msec MV E velocity: 108.00 cm/s MV A velocity: 86.50 cm/s MV E/A ratio:  1.25 Charlton Haws MD Electronically signed by Charlton Haws MD Signature Date/Time: 07/13/2021/11:09:22 AM    Final     Cardiac Studies   Cath films personally reviewed. EF 50%  Patient Profile     62 y.o. male with inferior MI.  Feeling well today.  Assessment & Plan    CAD/inferior MI: Hemodynamically stable.  Has residual disease in the LAD at the origin of a diagonal.  Discussed the case with Dr. Tresa Endo.  We will plan for PCI today of the mid LAD.  There is some moderate disease after the bifurcation.  Continue dual antiplatelet therapy.  High-dose atorvastatin.  LDL 167.  Whole food, plant-based diet recommended.  A1c 5.6.  Would not start ACE inhibitor as of yet as his blood pressure is borderline  and he will be getting additional contrast dye for the catheterization today.  For questions or updates, please contact [facility] Please consult [email or web address] for contact info under        Signed, Lance MussJayadeep Sammye Staff, MD  07/14/2021, 8:40 AM

## 2021-07-14 NOTE — Interval H&P Note (Signed)
Cath Lab Visit (complete for each Cath Lab visit)  Clinical Evaluation Leading to the Procedure:   ACS: No.  Non-ACS:    Anginal Classification: CCS II  Anti-ischemic medical therapy: Minimal Therapy (1 class of medications)  Non-Invasive Test Results: No non-invasive testing performed  Prior CABG: No previous CABG      History and Physical Interval Note:  07/14/2021 5:12 PM  Eric Anderson  has presented today for surgery, with the diagnosis of cad.  The various methods of treatment have been discussed with the patient and family. After consideration of risks, benefits and other options for treatment, the patient has consented to  Procedure(s): CORONARY STENT INTERVENTION (N/A) Left Heart Cath (N/A) as a surgical intervention.  The patient's history has been reviewed, patient examined, no change in status, stable for surgery.  I have reviewed the patient's chart and labs.  Questions were answered to the patient's satisfaction.     [name]

## 2021-07-15 ENCOUNTER — Encounter (HOSPITAL_COMMUNITY): Payer: Self-pay | Admitting: Cardiovascular Disease

## 2021-07-15 ENCOUNTER — Other Ambulatory Visit (HOSPITAL_COMMUNITY): Payer: Self-pay

## 2021-07-15 DIAGNOSIS — I2111 ST elevation (STEMI) myocardial infarction involving right coronary artery: Secondary | ICD-10-CM | POA: Diagnosis not present

## 2021-07-15 DIAGNOSIS — R030 Elevated blood-pressure reading, without diagnosis of hypertension: Secondary | ICD-10-CM | POA: Diagnosis not present

## 2021-07-15 DIAGNOSIS — E782 Mixed hyperlipidemia: Secondary | ICD-10-CM | POA: Diagnosis not present

## 2021-07-15 LAB — BASIC METABOLIC PANEL
Anion gap: 12 (ref 5–15)
BUN: 12 mg/dL (ref 6–20)
CO2: 18 mmol/L — ABNORMAL LOW (ref 22–32)
Calcium: 8.6 mg/dL — ABNORMAL LOW (ref 8.9–10.3)
Chloride: 109 mmol/L (ref 98–111)
Creatinine, Ser: 0.96 mg/dL (ref 0.61–1.24)
GFR, Estimated: >60 mL/min (ref >60)
Glucose, Bld: 101 mg/dL — ABNORMAL HIGH (ref 70–99)
Potassium: 4.5 mmol/L (ref 3.5–5.1)
Sodium: 139 mmol/L (ref 135–145)

## 2021-07-15 LAB — CBC
HCT: 35.5 % — ABNORMAL LOW (ref 39.0–52.0)
Hemoglobin: 11.7 g/dL — ABNORMAL LOW (ref 13.0–17.0)
MCH: 27.8 pg (ref 26.0–34.0)
MCHC: 33 g/dL (ref 30.0–36.0)
MCV: 84.3 fL (ref 80.0–100.0)
Platelets: 292 10*3/uL (ref 150–400)
RBC: 4.21 MIL/uL — ABNORMAL LOW (ref 4.22–5.81)
RDW: 13.1 % (ref 11.5–15.5)
WBC: 13.5 10*3/uL — ABNORMAL HIGH (ref 4.0–10.5)
nRBC: 0 % (ref 0.0–0.2)

## 2021-07-15 MED ORDER — ASPIRIN 81 MG PO CHEW
81.0000 mg | CHEWABLE_TABLET | Freq: Every day | ORAL | 3 refills | Status: DC
  Filled 2021-07-15: qty 30, 30d supply, fill #0

## 2021-07-15 MED ORDER — METOPROLOL TARTRATE 25 MG PO TABS
12.5000 mg | ORAL_TABLET | Freq: Two times a day (BID) | ORAL | 3 refills | Status: DC
  Filled 2021-07-15: qty 30, 30d supply, fill #0

## 2021-07-15 MED ORDER — ATORVASTATIN CALCIUM 80 MG PO TABS
80.0000 mg | ORAL_TABLET | Freq: Every day | ORAL | 3 refills | Status: DC
  Filled 2021-07-15: qty 30, 30d supply, fill #0

## 2021-07-15 MED ORDER — NITROGLYCERIN 0.4 MG SL SUBL
0.4000 mg | SUBLINGUAL_TABLET | SUBLINGUAL | 4 refills | Status: DC | PRN
  Filled 2021-07-15: qty 25, 7d supply, fill #0

## 2021-07-15 MED ORDER — TICAGRELOR 90 MG PO TABS
90.0000 mg | ORAL_TABLET | Freq: Two times a day (BID) | ORAL | 3 refills | Status: DC
Start: 2021-07-15 — End: 2021-07-31
  Filled 2021-07-15: qty 60, 30d supply, fill #0

## 2021-07-15 NOTE — Discharge Summary (Addendum)
Discharge Summary    Patient ID: Eric Anderson MRN: [id number]; DOB: 02-Oct-1960  Admit date: 07/12/2021 Discharge date: 07/15/2021  PCP:  [name], MD   [facility] Providers Cardiologist:  [name], MD   {   Discharge Diagnoses    Principal Problem:   STEMI involving right coronary artery [facility]) Active Problems:   HLD (hyperlipidemia)   ST elevation myocardial infarction involving right coronary artery [facility])   Coronary artery disease involving native coronary artery of native heart    Diagnostic Studies/Procedures    Cath 07/12/2021    Prox RCA lesion is 100% stenosed.   3rd RPL lesion is 100% stenosed.   2nd RPL lesion is 70% stenosed.   2nd Diag lesion is 75% stenosed.   Mid Cx to Dist Cx lesion is 70% stenosed.   Mid LAD-1 lesion is 80% stenosed with 80% stenosed side branch in 2nd Sept.   Mid LAD-2 lesion is 50% stenosed.   RPDA lesion is 40% stenosed.   A drug-eluting stent was successfully placed.   Post intervention, there is a 0% residual stenosis.   Post intervention, there is a 0% residual stenosis.   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   Multivessel CAD with mild coronary calcification of the LAD at the ostium with 50% proximal stenosis, complex 80 and 75% stenosis of the proximal to mid LAD and ostial diagonal vessel with 50% mid LAD stenosis; 50% proximal stenosis in the ramus intermediate vessel; 70% proximal circumflex stenosis with total occlusion disease (appears chronic) of the mid vessel after small marginal branches with very faint filling of diminutive small branches distally.   LVEDP 12 mmHg.   Successful percutaneous coronary intervention of the large dominant RCA with PTCA and stenting of the diffusely diseased proximal RCA with insertion of a 3.5 x 26 mm Medtronic Onyx frontiers stent postdilated to 3.78 mm with the proximal stenosis being reduced to 0%.  There  was significant thrombus burden for which Aggrastat was started.  In addition, with initial reperfusion, the patient became bradycardic with junctional rhythm treated with atropine 0.5 mg x 2 and due to hypotension with blood pressure dropping into the 60s received IV fluid bolus and Levophed inotropic therapy.    Successful PTCA of the totally occluded PLA branch with restoration of TIMI-3 flow.   RECOMMENDATION: DAPT for minimum of 1 year but most likely longer.  Initiate medical therapy for concomitant CAD.  Will ask colleagues to review concerning potential need for LAD intervention following stability.  Suspect the circumflex occlusion is chronic and will treat medically.  Aggressive lipid-lowering therapy with target LDL less than 70.  A 2D echo Doppler study will be obtained in a.m. for assessment of LV function.    Diagnostic Dominance: Right Intervention     Echo 07/13/2021   1. Mid / basal inferior hypokinesis . Left ventricular ejection fraction,  by estimation, is 50 to 55%. The left ventricle has low normal function.  The left ventricle demonstrates regional wall motion abnormalities (see  scoring diagram/findings for  description). There is moderate left ventricular hypertrophy. Left  ventricular diastolic parameters were normal.   2. ? RV infarction . Right ventricular systolic function is moderately  reduced. The right ventricular size is moderately enlarged.   3. Right atrial size was mildly dilated.   4. The mitral valve is abnormal. Trivial mitral valve regurgitation. No  evidence of mitral stenosis.   5. The  aortic valve is tricuspid. There is mild calcification of the  aortic valve. Aortic valve regurgitation is not visualized. Aortic valve  sclerosis is present, with no evidence of aortic valve stenosis.   6. The inferior vena cava is dilated in size with >50% respiratory  variability, suggesting right atrial pressure of 8 mmHg.   Cath 07/14/2021    Mid LAD-1  lesion is 80% stenosed with 80% stenosed side branch in 2nd Sept.   Mid LAD-2 lesion is 70% stenosed.   Mid Cx to Dist Cx lesion is 70% stenosed.   2nd Diag lesion is 80% stenosed.   A drug-eluting stent was successfully placed.   Post intervention, there is a 0% residual stenosis.   Post intervention, there is a 0% residual stenosis.   Post intervention, the side branch was reduced to 0% residual stenosis.   Successful PCI involving complex stenosis in the proximal LAD at the takeoff of the septal and diagonal vessel with 80% ostial diagonal stenosis with a sharp angled segment and 70% mid stenosis of the LAD.  A score flex balloon was initially used at the complex LAD stenosis involving the trifurcation of septal and diagonal vessel to reduce potential for plaque shifting.  Following insertion of a 2.5 x 34 mm Medtronic Onyx Frontier stent postdilated to 2.84 proximally to 2.72 distally, the stenosis was reduced to 0%.  At the completion of the procedure there was TIMI-3 flow down the LAD as well as the diagonal vessel but there continued to be 80-85% ostial diagonal stenosis prior to the sharp bend in the vessel.    RECOMMENDATION: In this patient who is status post acute PCI to a totally occluded RCA treated successfully on July 11, 2021 recommend DAPT for minimum of 12 months but probably indefinitely.  Increase medical therapy for concomitant CAD with  chronic occlusion of the circumflex and residual diagonal disease.  Diagnostic Dominance: Right Intervention   _____________   History of Present Illness     Eric Anderson is a 61 y.o. male with HLD, BPH, and vitamin D deficiency who was seen 07/12/2021 for the evaluation of STEMI.  Eric Anderson has no prior cardiac history. He was out shopping with his wife on 2/4 and developed substernal chest tightness and diaphoresis prompting presentation to [facility] ER. EKG with STE inferior leads and anterolateral leads. CODE STEMI was activated;  however, STEMI MD at [facility] was not available. Dr. Tresa Endo notified and accepted in transfer to [facility] Cath lab for revascularization. ASA and heparin were given PTA, nitro was not given.    Hospital Course     Consultants: None    STEMI, CAD  - Patient presented with a STEMI on 2/4 - Underwent cardiac cath on 2/4 with successful PCI of the RCA with PTCA and DES placement. Cath also showed residual CAD including complex 80% proximal to mid LAD disease and 70% proximal circumflex disease with occlusion of the mid vessel - Following initial reperfusion, patient became bradycardic with junctional rhythm. Treated with aropine 0.5 mg x2. Was hypotensive and was given IV fluid bolus and levophed. Weaned off of levophed after about 2 hours of therapy and BP was stable.   - Interventional team discussed and decided to perform staged left system PCI - Underwent LHC on 2/6 with successful PCI in the proximal LAD at the takeoff of the septal and diagonal vessel  - Per cath report, recommended DAPT for minimum of 12 months, likely indefinitely - Continue ASA, brilinta - Continue  lipitor 80mg  daily  - Continue lopressor 12.5mg  BID  - Patient was on Imdur this admission, discontinued due to low BP   HLD  - LDL (calc) 167, HDL 38, triglycerides 90  - Started on lipitor 80mg  this admission  - Will need LFTs, lipid panel in 8 weeks   Patient was seen and examined by Dr. Eldridge Dace and deemed stable for discharge.   Did the patient have an acute coronary syndrome (MI, NSTEMI, STEMI, etc) this admission?:  Yes                               AHA/ACC Clinical Performance & Quality Measures: Aspirin prescribed? - Yes ADP Receptor Inhibitor (Plavix/Clopidogrel, Brilinta/Ticagrelor or Effient/Prasugrel) prescribed (includes medically managed patients)? - Yes Beta Blocker prescribed? - Yes High Intensity Statin (Lipitor 40-80mg  or Crestor 20-40mg ) prescribed? - Yes EF assessed during THIS hospitalization? - Yes For EF  <40%, was ACEI/ARB prescribed? - Not Applicable (EF >/= 40%) For EF <40%, Aldosterone Antagonist (Spironolactone or Eplerenone) prescribed? - Not Applicable (EF >/= 40%) Cardiac Rehab Phase II ordered (including medically managed patients)? - Yes       The patient will be scheduled for a TOC follow up appointment in 7-14 days.  A message has been sent to the [facility] and Scheduling Pool at the office where the patient should be seen for follow up.  _____________  Discharge Vitals Blood pressure 112/65, pulse 67, temperature 99.4 F (37.4 C), temperature source Oral, resp. rate 19, height 5\' 9"  (1.753 m), weight 95.2 kg, SpO2 95 %.  Filed Weights   07/12/21 2043  Weight: 95.2 kg    Labs & Radiologic Studies    CBC Recent Labs    07/14/21 0239 07/15/21 0103  WBC 14.0* 13.5*  HGB 12.4* 11.7*  HCT 37.1* 35.5*  MCV 83.6 84.3  PLT 282 292   Basic Metabolic Panel Recent Labs    65/78/46 0239 07/15/21 0103  NA 136 139  K 3.9 4.5  CL 107 109  CO2 21* 18*  GLUCOSE 122* 101*  BUN 10 12  CREATININE 0.81 0.96  CALCIUM 8.5* 8.6*   Liver Function Tests Recent Labs    07/12/21 1326  AST 32  ALT 34  ALKPHOS 68  BILITOT 0.8  PROT 6.6  ALBUMIN 3.9   No results for input(s): LIPASE, AMYLASE in the last 72 hours. High Sensitivity Troponin:   Recent Labs  Lab 07/12/21 1326 07/13/21 0617  TROPONINIHS 180* >24,000*    BNP Invalid input(s): POCBNP D-Dimer No results for input(s): DDIMER in the last 72 hours. Hemoglobin A1C Recent Labs    07/12/21 1326  HGBA1C 5.6   Fasting Lipid Panel Recent Labs    07/12/21 1326  CHOL 223*  HDL 38*  LDLCALC 167*  TRIG 90  CHOLHDL 5.9   Thyroid Function Tests No results for input(s): TSH, T4TOTAL, T3FREE, THYROIDAB in the last 72 hours.  Invalid input(s): FREET3 _____________  CARDIAC CATHETERIZATION  Result Date: 07/14/2021   Mid LAD-1 lesion is 80% stenosed with 80% stenosed side branch in 2nd Sept.   Mid LAD-2 lesion  is 70% stenosed.   Mid Cx to Dist Cx lesion is 70% stenosed.   2nd Diag lesion is 80% stenosed.   A drug-eluting stent was successfully placed.   Post intervention, there is a 0% residual stenosis.   Post intervention, there is a 0% residual stenosis.   Post intervention, the side  branch was reduced to 0% residual stenosis. Successful PCI involving complex stenosis in the proximal LAD at the takeoff of the septal and diagonal vessel with 80% ostial diagonal stenosis with a sharp angled segment and 70% mid stenosis of the LAD.  A score flex balloon was initially used at the complex LAD stenosis involving the trifurcation of septal and diagonal vessel to reduce potential for plaque shifting.  Following insertion of a 2.5 x 34 mm Medtronic Onyx Frontier stent postdilated to 2.84 proximally to 2.72 distally, the stenosis was reduced to 0%.  At the completion of the procedure there was TIMI-3 flow down the LAD as well as the diagonal vessel but there continued to be 80-85% ostial diagonal stenosis prior to the sharp bend in the vessel. RECOMMENDATION: In this patient who is status post acute PCI to a totally occluded RCA treated successfully on July 11, 2021 recommend DAPT for minimum of 12 months but probably indefinitely.  Increase medical therapy for concomitant CAD with  chronic occlusion of the circumflex and residual diagonal disease.   CARDIAC CATHETERIZATION  Result Date: 07/12/2021   Prox RCA lesion is 100% stenosed.   3rd RPL lesion is 100% stenosed.   2nd RPL lesion is 70% stenosed.   2nd Diag lesion is 75% stenosed.   Mid Cx to Dist Cx lesion is 70% stenosed.   Mid LAD-1 lesion is 80% stenosed with 80% stenosed side branch in 2nd Sept.   Mid LAD-2 lesion is 50% stenosed.   RPDA lesion is 40% stenosed.   A drug-eluting stent was successfully placed.   Post intervention, there is a 0% residual stenosis.   Post intervention, there is a 0% residual stenosis. Acute inferior lateral wall myocardial infarction  secondary to total occlusion of a very large dominant RCA. Multivessel CAD with mild coronary calcification of the LAD at the ostium with 50% proximal stenosis, complex 80 and 75% stenosis of the proximal to mid LAD and ostial diagonal vessel with 50% mid LAD stenosis; 50% proximal stenosis in the ramus intermediate vessel; 70% proximal circumflex stenosis with total occlusion disease (appears chronic) of the mid vessel after small marginal branches with very faint filling of diminutive small branches distally. LVEDP 12 mmHg. Successful percutaneous coronary intervention of the large dominant RCA with PTCA and stenting of the diffusely diseased proximal RCA with insertion of a 3.5 x 26 mm Medtronic Onyx frontiers stent postdilated to 3.78 mm with the proximal stenosis being reduced to 0%.  There was significant thrombus burden for which Aggrastat was started.  In addition, with initial reperfusion, the patient became bradycardic with junctional rhythm treated with atropine 0.5 mg x 2 and due to hypotension with blood pressure dropping into the 60s received IV fluid bolus and Levophed inotropic therapy. Successful PTCA of the totally occluded PLA branch with restoration of TIMI-3 flow. RECOMMENDATION: DAPT for minimum of 1 year but most likely longer.  Initiate medical therapy for concomitant CAD.  Will ask colleagues to review concerning potential need for LAD intervention following stability.  Suspect the circumflex occlusion is chronic and will treat medically.  Aggressive lipid-lowering therapy with target LDL less than 70.  A 2D echo Doppler study will be obtained in a.m. for assessment of LV function.   DG Chest Port 1 View  Result Date: 07/12/2021 CLINICAL DATA:  Chest pain, diaphoresis EXAM: PORTABLE CHEST 1 VIEW COMPARISON:  07/12/2021 FINDINGS: Mild bilateral interstitial thickening. No focal consolidation. No pleural effusion or pneumothorax. Heart and mediastinal contours are  unremarkable. No acute  osseous abnormality. IMPRESSION: 1. Bilateral interstitial thickening concerning for mild interstitial edema. Electronically Signed   By: Elige KoHetal  Patel M.D.   On: 07/12/2021 12:29   ECHOCARDIOGRAM COMPLETE  Result Date: 07/13/2021    ECHOCARDIOGRAM REPORT   [facility] Date of Exam: 07/13/2021 Medical Rec #:  [id number]         Height:       69.0 in Accession #:    [id number]        Weight:       209.9 lb Date of Birth:  01/18/1961          BSA:          2.109 m Patient Age:    60 years          BP:           114/78 mmHg Patient Gender: M                 HR:           61 bpm. Exam Location:  Inpatient Procedure: 2D Echo, Cardiac Doppler and Color Doppler Indications:    Acute myocardial infarction  History:        Patient has no prior history of Echocardiogram examinations.                 Risk Factors:Dyslipidemia. S/P PCI.  Sonographer:    Ross LudwigArthur Guy RDCS (AE) Referring Phys: [phone] [id number] THOMAS A KELLY IMPRESSIONS  1. Mid / basal inferior hypokinesis . Left ventricular ejection fraction, by estimation, is 50 to 55%. The left ventricle has low normal function. The left ventricle demonstrates regional wall motion abnormalities (see scoring diagram/findings for description). There is moderate left ventricular hypertrophy. Left ventricular diastolic parameters were normal.  2. ? RV infarction . Right ventricular systolic function is moderately reduced. The right ventricular size is moderately enlarged.  3. Right atrial size was mildly dilated.  4. The mitral valve is abnormal. Trivial mitral valve regurgitation. No evidence of mitral stenosis.  5. The aortic valve is tricuspid. There is mild calcification of the aortic valve. Aortic valve regurgitation is not visualized. Aortic valve sclerosis is present, with no evidence of aortic valve stenosis.  6. The inferior vena cava is dilated in size with >50% respiratory variability, suggesting right atrial pressure of 8 mmHg. FINDINGS  Left Ventricle: Mid /  basal inferior hypokinesis. Left ventricular ejection fraction, by estimation, is 50 to 55%. The left ventricle has low normal function. The left ventricle demonstrates regional wall motion abnormalities. The left ventricular internal cavity size was normal in size. There is moderate left ventricular hypertrophy. Left ventricular diastolic parameters were normal. Right Ventricle: ? RV infarction. The right ventricular size is moderately enlarged. No increase in right ventricular wall thickness. Right ventricular systolic function is moderately reduced. Left Atrium: Left atrial size was normal in size. Right Atrium: Right atrial size was mildly dilated. Pericardium: There is no evidence of pericardial effusion. Mitral Valve: The mitral valve is abnormal. There is mild thickening of the mitral valve leaflet(s). There is mild calcification of the mitral valve leaflet(s). Trivial mitral valve regurgitation. No evidence of mitral valve stenosis. MV peak gradient, 4.4 mmHg. The mean mitral valve gradient is 2.0 mmHg. Tricuspid Valve: The tricuspid valve is normal in structure. Tricuspid valve regurgitation is mild . No evidence of tricuspid stenosis. Aortic Valve: The aortic valve is tricuspid. There is mild calcification of the aortic valve. Aortic valve  regurgitation is not visualized. Aortic valve sclerosis is present, with no evidence of aortic valve stenosis. Aortic valve mean gradient measures 4.0 mmHg. Aortic valve peak gradient measures 7.8 mmHg. Aortic valve area, by VTI measures 2.13 cm. Pulmonic Valve: The pulmonic valve was normal in structure. Pulmonic valve regurgitation is not visualized. No evidence of pulmonic stenosis. Aorta: The aortic root is normal in size and structure. Venous: The inferior vena cava is dilated in size with greater than 50% respiratory variability, suggesting right atrial pressure of 8 mmHg. IAS/Shunts: No atrial level shunt detected by color flow Doppler.  LEFT VENTRICLE PLAX 2D  LVIDd:         5.10 cm   Diastology LVIDs:         3.20 cm   LV e' medial:    10.30 cm/s LV PW:         1.40 cm   LV E/e' medial:  10.5 LV IVS:        1.50 cm   LV e' lateral:   19.10 cm/s LVOT diam:     1.90 cm   LV E/e' lateral: 5.7 LV SV:         58 LV SV Index:   28 LVOT Area:     2.84 cm  RIGHT VENTRICLE             IVC RV Basal diam:  3.40 cm     IVC diam: 2.30 cm RV S prime:     13.50 cm/s TAPSE (M-mode): 2.2 cm LEFT ATRIUM           Index        RIGHT ATRIUM           Index LA diam:      3.60 cm 1.71 cm/m   RA Area:     13.90 cm LA Vol (A2C): 46.5 ml 22.05 ml/m  RA Volume:   37.30 ml  17.69 ml/m LA Vol (A4C): 32.6 ml 15.46 ml/m  AORTIC VALVE AV Area (Vmax):    2.37 cm AV Area (Vmean):   2.18 cm AV Area (VTI):     2.13 cm AV Vmax:           140.00 cm/s AV Vmean:          94.100 cm/s AV VTI:            0.273 m AV Peak Grad:      7.8 mmHg AV Mean Grad:      4.0 mmHg LVOT Vmax:         117.00 cm/s LVOT Vmean:        72.300 cm/s LVOT VTI:          0.205 m LVOT/AV VTI ratio: 0.75  AORTA Ao Root diam: 3.30 cm Ao Asc diam:  2.90 cm MITRAL VALVE MV Area (PHT): 3.74 cm     SHUNTS MV Area VTI:   1.78 cm     Systemic VTI:  0.20 m MV Peak grad:  4.4 mmHg     Systemic Diam: 1.90 cm MV Mean grad:  2.0 mmHg MV Vmax:       1.05 m/s MV Vmean:      57.4 cm/s MV Decel Time: 203 msec MV E velocity: 108.00 cm/s MV A velocity: 86.50 cm/s MV E/A ratio:  1.25 Charlton Haws MD Electronically signed by Charlton Haws MD Signature Date/Time: 07/13/2021/11:09:22 AM    Final    Disposition   Pt is being discharged home today in good  condition.  Follow-up Plans & Appointments     Follow-up Information     Ronney Asters, NP Follow up on 07/24/2021.   Specialty: Cardiology Why: 2:20 PM Contact information: [address] [place] [address] [phone]                Discharge Instructions     Amb Referral to Cardiac Rehabilitation   Complete by: As directed    Diagnosis:  Coronary  Stents STEMI     After initial evaluation and assessments completed: Virtual Based Care may be provided alone or in conjunction with Phase 2 Cardiac Rehab based on patient barriers.: Yes   Diet - low sodium heart healthy   Complete by: As directed    Discharge instructions   Complete by: As directed    PLEASE DO NOT MISS ANY DOSES OF YOUR BRILINTA/!!!!! Also keep a log of you blood pressures and bring back to your follow up appt. Please call the office with any questions.   Patients taking blood thinners should generally stay away from medicines like ibuprofen, Advil, Motrin, naproxen, and Aleve due to risk of stomach bleeding. You may take Tylenol as directed or talk to your primary doctor about alternatives.  PLEASE ENSURE THAT YOU DO NOT RUN OUT OF YOUR BRILINTA. This medication is very important to remain on for at least one year. IF you have issues obtaining this medication due to cost please CALL the office 3-5 business days prior to running out in order to prevent missing doses of this medication.   Radial Site Care Refer to this sheet in the next few weeks. These instructions provide you with information on caring for yourself after your procedure. Your caregiver may also give you more specific instructions. Your treatment has been planned according to current medical practices, but problems sometimes occur. Call your caregiver if you have any problems or questions after your procedure. HOME CARE INSTRUCTIONS You may shower the day after the procedure. Remove the bandage (dressing) and gently wash the site with plain soap and water. Gently pat the site dry.  Do not apply powder or lotion to the site.  Do not submerge the affected site in water for 3 to 5 days.  Inspect the site at least twice daily.  Do not flex or bend the affected arm for 24 hours.  No lifting over 5 pounds (2.3 kg) for 5 days after your procedure.  Do not drive home if you are discharged the same day of the  procedure. Have someone else drive you.  You may drive 24 hours after the procedure unless otherwise instructed by your caregiver.  What to expect: Any bruising will usually fade within 1 to 2 weeks.  Blood that collects in the tissue (hematoma) may be painful to the touch. It should usually decrease in size and tenderness within 1 to 2 weeks.  SEEK IMMEDIATE MEDICAL CARE IF: You have unusual pain at the radial site.  You have redness, warmth, swelling, or pain at the radial site.  You have drainage (other than a small amount of blood on the dressing).  You have chills.  You have a fever or persistent symptoms for more than 72 hours.  You have a fever and your symptoms suddenly get worse.  Your arm becomes pale, cool, tingly, or numb.  You have heavy bleeding from the site. Hold pressure on the site.    You have been prescribed sublingual nitroglycerin to take if you develop chest  pain. Place one tablet under your tongue. Can be repeated every 5 minutes for up to 3 doses. Do not take Tadalafil(Cialis) when taking nitroglycerine. If you have taken Tadalafil (cialis) within 24 hours and you develop chest pain, DO NOT take nitroglycerine. Similarly, if you have taken nitroglycerin within 24 hours, do not take tadalafil (cialis)   Increase activity slowly   Complete by: As directed        Discharge Medications   Allergies as of 07/15/2021   No Known Allergies      Medication List     TAKE these medications    aspirin 81 MG chewable tablet Chew 1 tablet (81 mg total) by mouth daily. Start taking on: July 16, 2021   atorvastatin 80 MG tablet Commonly known as: LIPITOR Take 1 tablet (80 mg total) by mouth daily. Start taking on: July 16, 2021   cyclobenzaprine 5 MG tablet Commonly known as: FLEXERIL Take 5 mg by mouth 3 (three) times daily as needed for muscle spasms.   finasteride 5 MG tablet Commonly known as: PROSCAR Take 5 mg by mouth daily.   FLAXSEED OIL  PO Take 1 capsule by mouth 3 (three) times daily.   ibuprofen 200 MG tablet Commonly known as: ADVIL Take 400 mg by mouth every 6 (six) hours as needed for mild pain.   metoprolol tartrate 25 MG tablet Commonly known as: LOPRESSOR Take 1/2 tablets (12.5 mg total) by mouth 2 (two) times daily.   nitroGLYCERIN 0.4 MG SL tablet Commonly known as: Nitrostat Place 1 tablet (0.4 mg total) under the tongue every 5 (five) minutes as needed for chest pain. For up to 3 doses   omeprazole 20 MG capsule Commonly known as: PRILOSEC Take 20 mg by mouth daily.   ondansetron 4 MG disintegrating tablet Commonly known as: Zofran ODT Take 1 tablet (4 mg total) by mouth every 8 (eight) hours as needed for nausea or vomiting.   SUPER B COMPLEX PO Take 1 tablet by mouth daily.   tadalafil 20 MG tablet Commonly known as: CIALIS Take 20 mg by mouth daily as needed for erectile dysfunction.   ticagrelor 90 MG Tabs tablet Commonly known as: BRILINTA Take 1 tablet (90 mg total) by mouth 2 (two) times daily.   venlafaxine XR 150 MG 24 hr capsule Commonly known as: EFFEXOR-XR Take 150 mg by mouth daily with breakfast.           Outstanding Labs/Studies   LFTs and Lipid panel in 8 weeks   Duration of Discharge Encounter   Greater than 30 minutes including physician time.  Signed, Jonita Albee, PA-C 07/15/2021, 11:20 AM  I have examined the patient and reviewed assessment and plan and discussed with patient.  Agree with above as stated.    CAD/MI: Continue dual antiplatelet therapy.  He will need aggressive secondary prevention.  Whole food, plant-based diet recommended.  Regular exercise including cardiac rehab recommended.  Residual disease in diagonal.  Given his low blood pressure, will hold Imdur.  Hyperlipidemia: Will need high-dose statin.  We spoke about whole food, plant-based diet.  High-fiber foods preferred.  Avoid processed foods.  Avoid meat.  He will have some  difficulty with this as he admits to consuming a lot of meat.  Recheck lipids in 3 months after he has been on his high-dose statin.   Blood pressure still borderline.  We will hold off on ACE inhibitor at this time.  Can be reassessed as an outpatient.  Renelda Loma  Eldridge DaceVaranasi

## 2021-07-15 NOTE — Progress Notes (Signed)
Progress Note  Patient Name: Eric Anderson Date of Encounter: 07/15/2021  [facility] Cardiologist: Nicki Guadalajarahomas Kelly, MD   Subjective   Had LAD intervention yesterday.  Mild dizziness when standing to go to the bathroom today.  Otherwise feels well and wants to go home.  Inpatient Medications    Scheduled Meds:  aspirin  81 mg Oral Daily   atorvastatin  80 mg Oral Daily   Chlorhexidine Gluconate Cloth  6 each Topical Daily   enoxaparin (LOVENOX) injection  40 mg Subcutaneous Q24H   finasteride  5 mg Oral Daily   isosorbide mononitrate  15 mg Oral Daily   metoprolol tartrate  12.5 mg Oral BID   pantoprazole  40 mg Oral Daily   sodium chloride flush  3 mL Intravenous Q12H   sodium chloride flush  3 mL Intravenous Q12H   ticagrelor  90 mg Oral BID   venlafaxine XR  150 mg Oral Q breakfast   venlafaxine XR  150 mg Oral Once   Continuous Infusions:  sodium chloride Stopped (07/13/21 0442)   sodium chloride Stopped (07/13/21 0929)   sodium chloride Stopped (07/13/21 0708)   sodium chloride 125 mL/hr at 07/15/21 0600   sodium chloride     PRN Meds: sodium chloride, sodium chloride, acetaminophen, diazepam, ondansetron (ZOFRAN) IV, sodium chloride flush, sodium chloride flush, zolpidem   Vital Signs    Vitals:   07/15/21 0600 07/15/21 0700 07/15/21 0745 07/15/21 0759  BP: (!) 103/58 104/64 112/65   Pulse: (!) 56 (!) 57 67   Resp: 19 13 19    Temp:    98.5 F (36.9 C)  TempSrc:    Oral  SpO2: (!) 89% 99% 95%   Weight:      Height:        Intake/Output Summary (Last 24 hours) at 07/15/2021 0826 Last data filed at 07/15/2021 0600 Gross per 24 hour  Intake 2299.69 ml  Output 500 ml  Net 1799.69 ml   Last 3 Weights 07/12/2021 07/12/2021 05/15/2020  Weight (lbs) 209 lb 14.1 oz 210 lb 205 lb  Weight (kg) 95.2 kg 95.255 kg 92.987 kg      Telemetry    Normal sinus rhythm- Personally Reviewed  ECG    Normal sinus rhythm, inferolateral T wave inversions- Personally  Reviewed  Physical Exam   GEN: No acute distress.   Neck: No JVD Cardiac: RRR, no murmurs, rubs, or gallops.  Respiratory: Clear to auscultation bilaterally. GI: Soft, nontender, non-distended  MS: No edema; No deformity.  Right radial site without hematoma, 2+ right radial pulse Neuro:  Nonfocal  Psych: Normal affect   Labs    High Sensitivity Troponin:   Recent Labs  Lab 07/12/21 1326 07/13/21 0617  TROPONINIHS 180* >24,000*     Chemistry Recent Labs  Lab 07/12/21 1326 07/12/21 1327 07/13/21 0030 07/14/21 0239 07/15/21 0103  NA 135   < > 136 136 139  K 3.6   < > 3.9 3.9 4.5  CL 108   < > 106 107 109  CO2 19*  --  21* 21* 18*  GLUCOSE 144*   < > 133* 122* 101*  BUN 13   < > 10 10 12   CREATININE 0.82   < > 0.83 0.81 0.96  CALCIUM 8.7*  --  8.2* 8.5* 8.6*  PROT 6.6  --   --   --   --   ALBUMIN 3.9  --   --   --   --  AST 32  --   --   --   --   ALT 34  --   --   --   --   ALKPHOS 68  --   --   --   --   BILITOT 0.8  --   --   --   --   GFRNONAA >60  --  >60 >60 >60  ANIONGAP 8  --  9 8 12    < > = values in this interval not displayed.    Lipids  Recent Labs  Lab 07/12/21 1326  CHOL 223*  TRIG 90  HDL 38*  LDLCALC 167*  CHOLHDL 5.9    Hematology Recent Labs  Lab 07/13/21 0030 07/14/21 0239 07/15/21 0103  WBC 13.4* 14.0* 13.5*  RBC 4.39 4.44 4.21*  HGB 11.8* 12.4* 11.7*  HCT 36.5* 37.1* 35.5*  MCV 83.1 83.6 84.3  MCH 26.9 27.9 27.8  MCHC 32.3 33.4 33.0  RDW 13.0 13.2 13.1  PLT 306 282 292   Thyroid No results for input(s): TSH, FREET4 in the last 168 hours.  BNPNo results for input(s): BNP, PROBNP in the last 168 hours.  DDimer No results for input(s): DDIMER in the last 168 hours.   Radiology    CARDIAC CATHETERIZATION  Result Date: 07/14/2021   Mid LAD-1 lesion is 80% stenosed with 80% stenosed side branch in 2nd Sept.   Mid LAD-2 lesion is 70% stenosed.   Mid Cx to Dist Cx lesion is 70% stenosed.   2nd Diag lesion is 80% stenosed.   A  drug-eluting stent was successfully placed.   Post intervention, there is a 0% residual stenosis.   Post intervention, there is a 0% residual stenosis.   Post intervention, the side branch was reduced to 0% residual stenosis. Successful PCI involving complex stenosis in the proximal LAD at the takeoff of the septal and diagonal vessel with 80% ostial diagonal stenosis with a sharp angled segment and 70% mid stenosis of the LAD.  A score flex balloon was initially used at the complex LAD stenosis involving the trifurcation of septal and diagonal vessel to reduce potential for plaque shifting.  Following insertion of a 2.5 x 34 mm Medtronic Onyx Frontier stent postdilated to 2.84 proximally to 2.72 distally, the stenosis was reduced to 0%.  At the completion of the procedure there was TIMI-3 flow down the LAD as well as the diagonal vessel but there continued to be 80-85% ostial diagonal stenosis prior to the sharp bend in the vessel. RECOMMENDATION: In this patient who is status post acute PCI to a totally occluded RCA treated successfully on July 11, 2021 recommend DAPT for minimum of 12 months but probably indefinitely.  Increase medical therapy for concomitant CAD with  chronic occlusion of the circumflex and residual diagonal disease.   ECHOCARDIOGRAM COMPLETE  Result Date: 07/13/2021    ECHOCARDIOGRAM REPORT   Patient Name:   Eric Anderson [facility] Date of Exam: 07/13/2021 Medical Rec #:  [id number]         Height:       69.0 in Accession #:    [id number]        Weight:       209.9 lb Date of Birth:  June 27, 1960          BSA:          2.109 m Patient Age:    61 years          BP:  114/78 mmHg Patient Gender: M                 HR:           61 bpm. Exam Location:  Inpatient Procedure: 2D Echo, Cardiac Doppler and Color Doppler Indications:    Acute myocardial infarction  History:        Patient has no prior history of Echocardiogram examinations.                 Risk Factors:Dyslipidemia. S/P PCI.   Sonographer:    Ross Ludwig RDCS (AE) Referring Phys: [phone] THOMAS A KELLY IMPRESSIONS  1. Mid / basal inferior hypokinesis . Left ventricular ejection fraction, by estimation, is 50 to 55%. The left ventricle has low normal function. The left ventricle demonstrates regional wall motion abnormalities (see scoring diagram/findings for description). There is moderate left ventricular hypertrophy. Left ventricular diastolic parameters were normal.  2. ? RV infarction . Right ventricular systolic function is moderately reduced. The right ventricular size is moderately enlarged.  3. Right atrial size was mildly dilated.  4. The mitral valve is abnormal. Trivial mitral valve regurgitation. No evidence of mitral stenosis.  5. The aortic valve is tricuspid. There is mild calcification of the aortic valve. Aortic valve regurgitation is not visualized. Aortic valve sclerosis is present, with no evidence of aortic valve stenosis.  6. The inferior vena cava is dilated in size with >50% respiratory variability, suggesting right atrial pressure of 8 mmHg. FINDINGS  Left Ventricle: Mid / basal inferior hypokinesis. Left ventricular ejection fraction, by estimation, is 50 to 55%. The left ventricle has low normal function. The left ventricle demonstrates regional wall motion abnormalities. The left ventricular internal cavity size was normal in size. There is moderate left ventricular hypertrophy. Left ventricular diastolic parameters were normal. Right Ventricle: ? RV infarction. The right ventricular size is moderately enlarged. No increase in right ventricular wall thickness. Right ventricular systolic function is moderately reduced. Left Atrium: Left atrial size was normal in size. Right Atrium: Right atrial size was mildly dilated. Pericardium: There is no evidence of pericardial effusion. Mitral Valve: The mitral valve is abnormal. There is mild thickening of the mitral valve leaflet(s). There is mild calcification of the  mitral valve leaflet(s). Trivial mitral valve regurgitation. No evidence of mitral valve stenosis. MV peak gradient, 4.4 mmHg. The mean mitral valve gradient is 2.0 mmHg. Tricuspid Valve: The tricuspid valve is normal in structure. Tricuspid valve regurgitation is mild . No evidence of tricuspid stenosis. Aortic Valve: The aortic valve is tricuspid. There is mild calcification of the aortic valve. Aortic valve regurgitation is not visualized. Aortic valve sclerosis is present, with no evidence of aortic valve stenosis. Aortic valve mean gradient measures 4.0 mmHg. Aortic valve peak gradient measures 7.8 mmHg. Aortic valve area, by VTI measures 2.13 cm. Pulmonic Valve: The pulmonic valve was normal in structure. Pulmonic valve regurgitation is not visualized. No evidence of pulmonic stenosis. Aorta: The aortic root is normal in size and structure. Venous: The inferior vena cava is dilated in size with greater than 50% respiratory variability, suggesting right atrial pressure of 8 mmHg. IAS/Shunts: No atrial level shunt detected by color flow Doppler.  LEFT VENTRICLE PLAX 2D LVIDd:         5.10 cm   Diastology LVIDs:         3.20 cm   LV e' medial:    10.30 cm/s LV PW:         1.40 cm  LV E/e' medial:  10.5 LV IVS:        1.50 cm   LV e' lateral:   19.10 cm/s LVOT diam:     1.90 cm   LV E/e' lateral: 5.7 LV SV:         58 LV SV Index:   28 LVOT Area:     2.84 cm  RIGHT VENTRICLE             IVC RV Basal diam:  3.40 cm     IVC diam: 2.30 cm RV S prime:     13.50 cm/s TAPSE (M-mode): 2.2 cm LEFT ATRIUM           Index        RIGHT ATRIUM           Index LA diam:      3.60 cm 1.71 cm/m   RA Area:     13.90 cm LA Vol (A2C): 46.5 ml 22.05 ml/m  RA Volume:   37.30 ml  17.69 ml/m LA Vol (A4C): 32.6 ml 15.46 ml/m  AORTIC VALVE AV Area (Vmax):    2.37 cm AV Area (Vmean):   2.18 cm AV Area (VTI):     2.13 cm AV Vmax:           140.00 cm/s AV Vmean:          94.100 cm/s AV VTI:            0.273 m AV Peak Grad:       7.8 mmHg AV Mean Grad:      4.0 mmHg LVOT Vmax:         117.00 cm/s LVOT Vmean:        72.300 cm/s LVOT VTI:          0.205 m LVOT/AV VTI ratio: 0.75  AORTA Ao Root diam: 3.30 cm Ao Asc diam:  2.90 cm MITRAL VALVE MV Area (PHT): 3.74 cm     SHUNTS MV Area VTI:   1.78 cm     Systemic VTI:  0.20 m MV Peak grad:  4.4 mmHg     Systemic Diam: 1.90 cm MV Mean grad:  2.0 mmHg MV Vmax:       1.05 m/s MV Vmean:      57.4 cm/s MV Decel Time: 203 msec MV E velocity: 108.00 cm/s MV A velocity: 86.50 cm/s MV E/A ratio:  1.25 Charlton HawsPeter Nishan MD Electronically signed by Charlton HawsPeter Nishan MD Signature Date/Time: 07/13/2021/11:09:22 AM    Final     Cardiac Studies   Cath films personally reviewed  Patient Profile     61 y.o. male with inferior MI status post RCA stenting and LAD stenting yesterday.  Assessment & Plan    CAD/MI: Continue dual antiplatelet therapy.  He will need aggressive secondary prevention.  Whole food, plant-based diet recommended.  Regular exercise including cardiac rehab recommended.  Residual disease in diagonal.  Given his low blood pressure, will hold Imdur.  Hyperlipidemia: Will need high-dose statin.  We spoke about whole food, plant-based diet.  High-fiber foods preferred.  Avoid processed foods.  Avoid meat.  He will have some difficulty with this as he admits to consuming a lot of meat.  Recheck lipids in 3 months after he has been on his high-dose statin.  Blood pressure still borderline.  We will hold off on ACE inhibitor at this time.  Can be reassessed as an outpatient.  For questions or updates, please contact [facility] Please  consult [email or web address] for contact info under        Signed, Lance Muss, MD  07/15/2021, 8:26 AM

## 2021-07-15 NOTE — Progress Notes (Signed)
CARDIAC REHAB PHASE I   PRE:  Rate/Rhythm: 74 SR  BP:  Sitting: 106/57      SaO2: 95 RA  MODE:  Ambulation: 370 ft   POST:  Rate/Rhythm: 93 SR  BP:  Sitting: 117/78    SaO2: 97 RA   Pt ambulated 359ft in hallway independently with steady gait. Pt denies CP, SOB, or dizziness throughout. Reinforced importance of ASA, Brilinta, statin, and NTG. Reviewed site care, restrictions, and exercise guidelines. Referred to CRP II [place].  [id number] Reynold Bowen, RN BSN 07/15/2021 10:40 AM

## 2021-07-15 NOTE — Research (Signed)
V-Inception Research Study  Patient Contacted about potential participation in [employer] V-Inception.  Study was discussed with the patient and the opportunity to ask questions was given.  Patient was given copy of the consent.  Patient will be contacted in 5-7 days for Follow-up phone call about information on the study.    This patient is eligible to participate in V-Inception Study.  The study is comparing the initiation of Inclisiran on top of usual care in Patient's with a recent acute coronary syndrome within the past 5 weeks.  Eligibility Criteria: recent ACS within 5 weeks, Serum LCL-C greater than or equal to 70mg /dL or non-HDL-C greater than or equal to 100mg /dL, and taking statin therapy or statin intolerant (not on PCSK9 Inhibitors).  Its a randomized, controlled, multicenter, open-label trial comparing a hospital post-discharge care pathway involving aggressive LCL-C management that includes Inclisiran with usual care versus usual care alone in patients with a recent ACS.   Jasmine Pang, RN BSN [facility] Cardiovascular Research & Education Direct Line: [phone]

## 2021-07-22 ENCOUNTER — Telehealth: Payer: Self-pay | Admitting: Cardiovascular Disease

## 2021-07-22 NOTE — Telephone Encounter (Signed)
07/22/2021  Disability forms put in Dr.kelly box.   Once completed patient will need to come into office to complete Release, Billing, and Payment $29 (cash, check , or money order)

## 2021-07-23 ENCOUNTER — Encounter: Payer: Self-pay | Admitting: *Deleted

## 2021-07-23 DIAGNOSIS — Z006 Encounter for examination for normal comparison and control in clinical research program: Secondary | ICD-10-CM

## 2021-07-23 NOTE — Research (Addendum)
Follow-up Phone call placed to patient about enrollment in to V-Inception Research study.  Left message for patient to return call   1:49 PM Patient returned call.  He politely declined enrollment

## 2021-07-23 NOTE — Progress Notes (Addendum)
Cardiology Clinic Note   Patient Name: Eric Anderson Date of Encounter: 07/24/21  Primary Care Provider:  [place] Pink, MD Primary Cardiologist:  Shelva Majestic, MD  Patient Profile    Eric Anderson 61 year old male presents to the clinic today status post STEMI.  Past Medical History    Past Medical History:  Diagnosis Date   History of BPH    Hyperlipidemia    Urine retention    Past Surgical History:  Procedure Laterality Date   ANTERIOR FUSION CERVICAL SPINE     COLONOSCOPY WITH PROPOFOL N/A 05/15/2020   Procedure: COLONOSCOPY WITH PROPOFOL;  Surgeon: Toledo, Benay Pike, MD;  Location: [facility];  Service: Gastroenterology;  Laterality: N/A;   CORONARY STENT INTERVENTION N/A 07/14/2021   Procedure: CORONARY STENT INTERVENTION;  Surgeon: Troy Sine, MD;  Location: [place] [facility];  Service: Cardiovascular;  Laterality: N/A;   CORONARY/GRAFT ACUTE MI REVASCULARIZATION N/A 07/12/2021   Procedure: Coronary/Graft Acute MI Revascularization;  Surgeon: Troy Sine, MD;  Location: [place] [facility];  Service: Cardiovascular;  Laterality: N/A;   LAPAROSCOPIC APPENDECTOMY N/A 02/20/2015   Procedure: APPENDECTOMY LAPAROSCOPIC;  Surgeon: Dia Crawford III, MD;  Location: [facility];  Service: General;  Laterality: N/A;   LEFT HEART CATH N/A 07/14/2021   Procedure: Left Heart Cath;  Surgeon: Troy Sine, MD;  Location: [place] [facility];  Service: Cardiovascular;  Laterality: N/A;   LEFT HEART CATH AND CORONARY ANGIOGRAPHY N/A 07/12/2021   Procedure: LEFT HEART CATH AND CORONARY ANGIOGRAPHY;  Surgeon: Troy Sine, MD;  Location: [place] [facility];  Service: Cardiovascular;  Laterality: N/A;    Allergies  No Known Allergies  History of Present Illness    Eric Anderson has no prior cardiac history.  His PMH includes hyperlipidemia, BPH, and vitamin D deficiency.  He reported that he was out shopping with his wife on 07/12/2021.  He developed substernal chest  tightness and diaphoresis.  He presented to [facility] emergency department.  His EKG showed ST elevation in the inferior and anterolateral leads.  Code STEMI was activated.  However, the STEMI MD at [facility] was not available.  Dr. Claiborne Billings was notified and patient was transferred to [facility] for revascularization.  He was given aspirin, heparin and PTA.  He did not receive nitroglycerin.  He underwent cardiac catheterization 07/12/2021 which showed 100% proximal RCA, 100% RPL, 70% second RPL, 75% second diagonal, distal-mid circumflex 70%, mid LAD 180%, mid LAD to 50%, RPDA 40%.  He received DES with PCI to RCA.  He also received successful PTCA to totally occluded PLA branch with restoration of TIMI-3 flow.  He had staged PCI on 07/14/2021.  He received PCI with DES to proximal LAD.  His echocardiogram 07/13/2021 showed an LVEF of 50-55%, moderate LVH, moderately reduced RV function, mildly dilated right atria, trivial mitral valve regurgitation.  He presents to the clinic today for follow-up evaluation states he has noticed increased fatigue since having his cardiac catheterization.  We reviewed his angiography and echocardiogram.  He expressed understanding.  He reports compliance with his medications and denies bleeding issues.  He denies side effects.  We reviewed the importance of heart healthy low-sodium low-cholesterol diet and increasing physical activity.  He works long hours at [employer].  At this time his right radial cath site is clean dry intact and healing well.  I will give him permission to go back to work without restriction on 08/04/2021.  We will  repeat his fasting lipids and LFTs in 4 to 6 weeks, have him increase his physical activity as tolerated, and plan follow-up for 3 to 4 months.  Today he denies chest pain, shortness of breath, lower extremity edema,  palpitations, melena, hematuria, hemoptysis, diaphoresis, weakness, presyncope, syncope, orthopnea, and PND.   Home Medications     Prior to Admission medications   Medication Sig Start Date End Date Taking? Authorizing Provider  aspirin 81 MG chewable tablet Chew 1 tablet (81 mg total) by mouth daily. 07/16/21   Margie Billet, PA-C  atorvastatin (LIPITOR) 80 MG tablet Take 1 tablet (80 mg total) by mouth daily. 07/16/21   Margie Billet, PA-C  B Complex-C (SUPER B COMPLEX PO) Take 1 tablet by mouth daily.    [provider]  cyclobenzaprine (FLEXERIL) 5 MG tablet Take 5 mg by mouth 3 (three) times daily as needed for muscle spasms.    [provider]  finasteride (PROSCAR) 5 MG tablet Take 5 mg by mouth daily.    [provider]  Flaxseed, Linseed, (FLAXSEED OIL PO) Take 1 capsule by mouth 3 (three) times daily.    [provider]  ibuprofen (ADVIL,MOTRIN) 200 MG tablet Take 400 mg by mouth every 6 (six) hours as needed for mild pain.    [provider]  metoprolol tartrate (LOPRESSOR) 25 MG tablet Take 1/2 tablets (12.5 mg total) by mouth 2 (two) times daily. 07/15/21   Margie Billet, PA-C  nitroGLYCERIN (NITROSTAT) 0.4 MG SL tablet Place 1 tablet (0.4 mg total) under the tongue every 5 (five) minutes as needed for chest pain. For up to 3 doses 07/15/21 07/15/22  Margie Billet, PA-C  omeprazole (PRILOSEC) 20 MG capsule Take 20 mg by mouth daily.    [provider]  ondansetron (ZOFRAN ODT) 4 MG disintegrating tablet Take 1 tablet (4 mg total) by mouth every 8 (eight) hours as needed for nausea or vomiting. 02/25/15   Loney Hering, MD  tadalafil (CIALIS) 20 MG tablet Take 20 mg by mouth daily as needed for erectile dysfunction.    [provider]  ticagrelor (BRILINTA) 90 MG TABS tablet Take 1 tablet (90 mg total) by mouth 2 (two) times daily. 07/15/21   Margie Billet, PA-C  venlafaxine XR (EFFEXOR-XR) 150 MG 24 hr capsule Take 150 mg by mouth daily with breakfast.    [provider]    Family History    Family History  Problem  Relation Age of Onset   Heart disease Father    Diabetes Sister    Diabetes Brother    He indicated that his mother is deceased. He indicated that his father is alive. He indicated that his sister is alive. He indicated that his brother is alive.  Social History    Social History   Socioeconomic History   Marital status: Married    Spouse name: Not on file   Number of children: Not on file   Years of education: Not on file   Highest education level: Not on file  Occupational History   Not on file  Tobacco Use   Smoking status: Never   Smokeless tobacco: Never  Vaping Use   Vaping Use: Never used  Substance and Sexual Activity   Alcohol use: Yes    Alcohol/week: 0.0 standard drinks    Comment: occasional   Drug use: No   Sexual activity: Not on file  Other Topics Concern   Not on file  Social History Narrative   Not on file   Social Determinants of Health   Financial Resource Strain: Not on file  Food Insecurity: Not on file  Transportation Needs: Not on file  Physical Activity: Not on file  Stress: Not on file  Social Connections: Not on file  Intimate Partner Violence: Not on file     Review of Systems    General:  No chills, fever, night sweats or weight changes.  Cardiovascular:  No chest pain, dyspnea on exertion, edema, orthopnea, palpitations, paroxysmal nocturnal dyspnea. Dermatological: No rash, lesions/masses Respiratory: No cough, dyspnea Urologic: No hematuria, dysuria Abdominal:   No nausea, vomiting, diarrhea, bright red blood per rectum, melena, or hematemesis Neurologic:  No visual changes, wkns, changes in mental status. All other systems reviewed and are otherwise negative except as noted above.  Physical Exam    VS:  BP 122/74    Pulse 64    Ht 5\' 8"  (1.727 m)    Wt 203 lb (92.1 kg)    SpO2 98%    BMI 30.87 kg/m  , BMI Body mass index is 30.87 kg/m. GEN: Well nourished, well developed, in no acute distress. HEENT: normal. Neck: Supple,  no JVD, carotid bruits, or masses. Cardiac: RRR, no murmurs, rubs, or gallops. No clubbing, cyanosis, edema.  Radials/DP/PT 2+ and equal bilaterally.  Respiratory:  Respirations regular and unlabored, clear to auscultation bilaterally. GI: Soft, nontender, nondistended, BS + x 4. MS: no deformity or atrophy. Skin: warm and dry, no rash. Neuro:  Strength and sensation are intact. Psych: Normal affect.  Accessory Clinical Findings    Recent Labs: 07/12/2021: ALT 34 07/15/2021: BUN 12; Creatinine, Ser 0.96; Hemoglobin 11.7; Platelets 292; Potassium 4.5; Sodium 139   Recent Lipid Panel    Component Value Date/Time   CHOL 223 (H) 07/12/2021 1326   TRIG 90 07/12/2021 1326   HDL 38 (L) 07/12/2021 1326   CHOLHDL 5.9 07/12/2021 1326   VLDL 18 07/12/2021 1326   LDLCALC 167 (H) 07/12/2021 1326    ECG personally reviewed by me today-normal sinus rhythm 64 bpm- No acute changes  Echocardiogram 07/13/2021   1. Mid / basal inferior hypokinesis . Left ventricular ejection fraction,  by estimation, is 50 to 55%. The left ventricle has low normal function.  The left ventricle demonstrates regional wall motion abnormalities (see  scoring diagram/findings for  description). There is moderate left ventricular hypertrophy. Left  ventricular diastolic parameters were normal.   2. ? RV infarction . Right ventricular systolic function is moderately  reduced. The right ventricular size is moderately enlarged.   3. Right atrial size was mildly dilated.   4. The mitral valve is abnormal. Trivial mitral valve regurgitation. No  evidence of mitral stenosis.   5. The aortic valve is tricuspid. There is mild calcification of the  aortic valve. Aortic valve regurgitation is not visualized. Aortic valve  sclerosis is present, with no evidence of aortic valve stenosis.   6. The inferior vena cava is dilated in size with >50% respiratory  variability, suggesting right atrial pressure of 8 mmHg.   Cardiac  catheterization 07/12/2021   Prox RCA lesion is 100% stenosed.   3rd RPL lesion is 100% stenosed.   2nd RPL lesion is 70% stenosed.   2nd Diag lesion is 75% stenosed.   Mid Cx to Dist Cx lesion is 70% stenosed.   Mid LAD-1 lesion is 80% stenosed with 80% stenosed side branch in 2nd Sept.   Mid LAD-2 lesion is  50% stenosed.   RPDA lesion is 40% stenosed.   A drug-eluting stent was successfully placed.   Post intervention, there is a 0% residual stenosis.   Post intervention, there is a 0% residual stenosis.   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   Multivessel CAD with mild coronary calcification of the LAD at the ostium with 50% proximal stenosis, complex 80 and 75% stenosis of the proximal to mid LAD and ostial diagonal vessel with 50% mid LAD stenosis; 50% proximal stenosis in the ramus intermediate vessel; 70% proximal circumflex stenosis with total occlusion disease (appears chronic) of the mid vessel after small marginal branches with very faint filling of diminutive small branches distally.   LVEDP 12 mmHg.   Successful percutaneous coronary intervention of the large dominant RCA with PTCA and stenting of the diffusely diseased proximal RCA with insertion of a 3.5 x 26 mm Medtronic Onyx frontiers stent postdilated to 3.78 mm with the proximal stenosis being reduced to 0%.  There was significant thrombus burden for which Aggrastat was started.  In addition, with initial reperfusion, the patient became bradycardic with junctional rhythm treated with atropine 0.5 mg x 2 and due to hypotension with blood pressure dropping into the 60s received IV fluid bolus and Levophed inotropic therapy.    Successful PTCA of the totally occluded PLA branch with restoration of TIMI-3 flow.   RECOMMENDATION: DAPT for minimum of 1 year but most likely longer.  Initiate medical therapy for concomitant CAD.  Will ask colleagues to review  concerning potential need for LAD intervention following stability.  Suspect the circumflex occlusion is chronic and will treat medically.  Aggressive lipid-lowering therapy with target LDL less than 70.  A 2D echo Doppler study will be obtained in a.m. for assessment of LV function.     Diagnostic Dominance: Right Intervention      Cardiac catheterization 07/14/2021    Mid LAD-1 lesion is 80% stenosed with 80% stenosed side branch in 2nd Sept.   Mid LAD-2 lesion is 70% stenosed.   Mid Cx to Dist Cx lesion is 70% stenosed.   2nd Diag lesion is 80% stenosed.   A drug-eluting stent was successfully placed.   Post intervention, there is a 0% residual stenosis.   Post intervention, there is a 0% residual stenosis.   Post intervention, the side branch was reduced to 0% residual stenosis.   Successful PCI involving complex stenosis in the proximal LAD at the takeoff of the septal and diagonal vessel with 80% ostial diagonal stenosis with a sharp angled segment and 70% mid stenosis of the LAD.  A score flex balloon was initially used at the complex LAD stenosis involving the trifurcation of septal and diagonal vessel to reduce potential for plaque shifting.  Following insertion of a 2.5 x 34 mm Medtronic Onyx Frontier stent postdilated to 2.84 proximally to 2.72 distally, the stenosis was reduced to 0%.  At the completion of the procedure there was TIMI-3 flow down the LAD as well as the diagonal vessel but there continued to be 80-85% ostial diagonal stenosis prior to the sharp bend in the vessel.    RECOMMENDATION: In this patient who is status post acute PCI to a totally occluded RCA treated successfully on July 11, 2021 recommend DAPT for minimum of 12 months but probably indefinitely.  Increase medical therapy for concomitant CAD with  chronic occlusion of the circumflex and residual diagonal disease.   Diagnostic Dominance:  Right Intervention   _____________  Assessment & Plan   1.   STEMI-denies episodes of arm neck back or chest discomfort since being discharged from the hospital.  Underwent staged PCI with PCI and DES x1 on 07/12/2021 to his RCA and again on 07/14/2021 with PCI and DES placement to his mid LAD.  He was noted to become bradycardic with junctional rhythm following reperfusion.  He was treated with atropine 0.5 mg x 2.  He was weaned off Levophed after 2 hours and his blood pressure stabilized.  His Imdur was discontinued Continue aspirin, Brilinta, atorvastatin, metoprolol Heart healthy low-sodium diet-salty 6 given Increase physical activity as tolerated Work note provided-May return to work on 08/04/2021 without restriction. Mindfulness stress reduction information given  Hyperlipidemia-07/12/2021: Cholesterol 223; HDL 38; LDL Cholesterol 167; Triglycerides 90; VLDL 18 Continue aspirin, atorvastatin Heart healthy low-sodium diet-salty 6 given Increase physical activity as tolerated Repeat fasting lipids and LFTs in 4 to 6 weeks.  Disposition: Follow-up with Dr. Claiborne Billings or me in 3-4 months.  Jossie Ng. Lajuan Kovaleski NP-C    07/31/2021, 3:53 PM [place] [address] Office [phone] Fax [phone]  Notice: This dictation was prepared with Dragon dictation along with smaller phrase technology. Any transcriptional errors that result from this process are unintentional and may not be corrected upon review.  I spent 13 minutes examining this patient, reviewing medications, and using patient centered shared decision making involving her cardiac care.  Prior to her visit I spent greater than 20 minutes reviewing her past medical history,  medications, and prior cardiac tests.

## 2021-07-24 ENCOUNTER — Other Ambulatory Visit: Payer: Self-pay

## 2021-07-24 ENCOUNTER — Encounter: Payer: Self-pay | Admitting: General Practice

## 2021-07-24 ENCOUNTER — Other Ambulatory Visit (HOSPITAL_COMMUNITY): Payer: Self-pay

## 2021-07-24 ENCOUNTER — Telehealth (HOSPITAL_COMMUNITY): Payer: Self-pay

## 2021-07-24 ENCOUNTER — Ambulatory Visit: Payer: Managed Care, Other (non HMO) | Admitting: General Practice

## 2021-07-24 VITALS — BP 122/74 | HR 64 | Ht 68.0 in | Wt 203.0 lb

## 2021-07-24 DIAGNOSIS — Z79899 Other long term (current) drug therapy: Secondary | ICD-10-CM

## 2021-07-24 DIAGNOSIS — I2111 ST elevation (STEMI) myocardial infarction involving right coronary artery: Secondary | ICD-10-CM

## 2021-07-24 DIAGNOSIS — E782 Mixed hyperlipidemia: Secondary | ICD-10-CM | POA: Diagnosis not present

## 2021-07-24 NOTE — Telephone Encounter (Signed)
Transitions of Care Pharmacy  ° °Call attempted for a pharmacy transitions of care follow-up. HIPAA appropriate voicemail was left with call back information provided.  ° °Call attempt #1. Will follow-up in 2-3 days.  °  °

## 2021-07-24 NOTE — Patient Instructions (Signed)
Medication Instructions:  The current medical regimen is effective;  continue present plan and medications as directed. Please refer to the Current Medication list given to you today.   *If you need a refill on your cardiac medications before your next appointment, please call your pharmacy*  Lab Work: FASTING LIPID AND LFT IN 4-6 WEEKS If you have labs (blood work) drawn today and your tests are completely normal, you will receive your results only by:  MyChart Message (if you have MyChart) OR A paper copy in the mail.  If you have any lab test that is abnormal or we need to change your treatment, we will call you to review the results. You may go to any [facility] that is convenient for you however, we do have a lab in our office that is able to assist you. You DO NOT need an appointment for our lab. The lab is open 8:00am and closes at 4:00pm. Lunch 12:45 - 1:45pm.  Special Instructions MAY RETURN TO WORK WITH NO RESTRICTIONS 08-04-2021  PLEASE READ AND FOLLOW SALTY 6-ATTACHED-1,800 mg daily  PLEASE INCREASE PHYSICAL ACTIVITY AS TOLERATED  PLEASE READ AND FOLLOW STRESS REDUCTION TIPS-ATTACHED  Follow-Up: Your next appointment:  3-4 month(s) In Person with [name], MD   At [facility], you and your health needs are our priority.  As part of our continuing mission to provide you with exceptional heart care, we have created designated Provider Care Teams.  These Care Teams include your primary Cardiologist (physician) and Advanced Practice Providers (APPs -  Physician Assistants and Nurse Practitioners) who all work together to provide you with the care you need, when you need it.         Mindfulness-Based Stress Reduction Mindfulness-based stress reduction (MBSR) is a program that helps people learn to practice mindfulness. Mindfulness is the practice of consciously paying attention to the present moment. MBSR focuses on developing self-awareness, which lets you respond to life  stress without judgment or negative feelings. It can be learned and practiced through techniques such as education, breathing exercises, meditation, and yoga. MBSR includes several mindfulness techniques in one program. MBSR works best when you understand the treatment, are willing to try new things, and can commit to spending time practicing what you learn. MBSR training may include learning about: How your feelings, thoughts, and reactions affect your body. New ways to respond to things that cause negative thoughts to start (triggers). How to notice your thoughts and let go of them. Practicing awareness of everyday things that you normally do without thinking. The techniques and goals of different types of meditation. What are the benefits of MBSR? MBSR can have many benefits, which include helping you to: Develop self-awareness. This means knowing and understanding yourself. Learn skills and attitudes that help you to take part in your own health care. Learn new ways to care for yourself. Be more accepting about how things are, and let things go. Be less judgmental and approach things with an open mind. Be patient with yourself and trust yourself more. MBSR has also been shown to: Reduce negative emotions, such as sadness, overwhelm, and worry. Improve memory and focus. Change how you sense and react to pain. Boost your body's ability to fight infections. Help you connect better with other people. Improve your sense of well-being. How to practice mindfulness To do a basic awareness exercise: Find a comfortable place to sit. Pay attention to the present moment. Notice your thoughts, feelings, and surroundings just as they are. Avoid  judging yourself, your feelings, or your surroundings. Make note of any judgment that comes up and let it go. Your mind may wander, and that is okay. Make note of when your thoughts drift, and return your attention to the present moment. To do basic  mindfulness meditation: Find a comfortable place to sit. This may include a stable chair or a firm floor cushion. Sit upright with your back straight. Let your arms fall next to your sides, with your hands resting on your legs. If you are sitting in a chair, rest your feet flat on the floor. If you are sitting on a cushion, cross your legs in front of you. Keep your head in a neutral position with your chin dropped slightly. Relax your jaw and rest the tip of your tongue on the roof of your mouth. Drop your gaze to the floor or close your eyes. Breathe normally and pay attention to your breath. Feel the air moving in and out of your nose. Feel your belly expanding and relaxing with each breath. Your mind may wander, and that is okay. Make note of when your thoughts drift, and return your attention to your breath. Avoid judging yourself, your feelings, or your surroundings. Make note of any judgment or feelings that come up, let them go, and bring your attention back to your breath. When you are ready, lift your gaze or open your eyes. Pay attention to how your body feels after the meditation. Follow these instructions at home:  Find a local in-person or online MBSR program. Set aside some time regularly for mindfulness practice. Practice every day if you can. Even 10 minutes of practice is helpful. Find a mindfulness practice that works best for you. This may include one or more of the following: Meditation. This involves focusing your mind on a certain thought or activity. Breathing awareness exercises. These help you to stay present by focusing on your breath. Body scan. For this practice, you lie down and pay attention to each part of your body from head to toe. You can identify tension and soreness and consciously relax parts of your body. Yoga. Yoga involves stretching and breathing, and it can improve your ability to move and be flexible. It can also help you to test your body's limits, which  can help you release stress. Mindful eating. This way of eating involves focusing on the taste, texture, color, and smell of each bite of food. This slows down eating and helps you feel full sooner. For this reason, it can be an important part of a weight loss plan. Find a podcast or recording that provides guidance for breathing awareness, body scan, or meditation exercises. You can listen to these any time when you have a free moment to rest without distractions. Follow your treatment plan as told by your health care provider. This may include taking regular medicines and making changes to your diet or lifestyle as recommended. Where to find more information You can find more information about MBSR from: Your health care provider. Community-based meditation centers or programs. Programs offered near you. Summary Mindfulness-based stress reduction (MBSR) is a program that teaches you how to consciously pay attention to the present moment. It is used to help you deal better with daily stress, feelings, and pain. MBSR focuses on developing self-awareness, which allows you to respond to life stress without judgment or negative feelings. MBSR programs may involve learning different mindfulness practices, such as breathing exercises, meditation, yoga, body scan, or mindful eating. Find  a mindfulness practice that works best for you, and set aside time for it on a regular basis. This information is not intended to replace advice given to you by your health care provider. Make sure you discuss any questions you have with your health care provider. Document Revised: 01/02/2021 Document Reviewed: 01/02/2021 Elsevier Patient Education  2022 [employer].

## 2021-07-25 ENCOUNTER — Telehealth (HOSPITAL_COMMUNITY): Payer: Self-pay

## 2021-07-25 NOTE — Telephone Encounter (Signed)
Pharmacy Transitions of Care Follow-up Telephone Call  Date of discharge: 07/15/21  Discharge Diagnosis: STEMI  How have you been since you were released from the hospital? Patient doing well but is wanting to exercise. Encouraged patient to speak with cardiologist about regimen and he has. Patient has also been experiencing occasional headaches since discharge. Encouraged patient to speak with MD if they continue or get worse as headaches could be related to new med regimen. Also encouraged patient to get a blood presure cuff. No questions about meds at this time.   Medication changes made at discharge: START taking: Aspirin Low Dose (aspirin)  atorvastatin (LIPITOR)  Brilinta (ticagrelor)  metoprolol tartrate (LOPRESSOR)  nitroGLYCERIN (Nitrostat)   Medication changes verified by the patient? Yes    Medication Accessibility:  Home Pharmacy: [facility]   Was the patient provided with refills on discharged medications? Yes   Have all prescriptions been transferred from [facility] to home pharmacy? No, cannot transfer meds   Is the patient able to afford medications? No insurance, will need assistance    Medication Review:  TICAGRELOR (BRILINTA) Ticagrelor 90 mg BID initiated on 07/24/21.  - Educated patient on expected duration of therapy of aspirin with ticagrelor.  - Discussed importance of taking medication around the same time every day - Advised patient of medications to avoid (NSAIDs, aspirin maintenance doses>100 mg daily) - Educated that Tylenol (acetaminophen) will be the preferred analgesic to prevent risk of bleeding  - Emphasized importance of monitoring for signs and symptoms of bleeding (abnormal bruising, prolonged bleeding, nose bleeds, bleeding from gums, discolored urine, black tarry stools)  - Educated patient to notify doctor if shortness of breath or abnormal heartbeat occur - Advised patient to alert all providers of antiplatelet therapy prior to starting a new  medication or having a procedure    Follow-up Appointments:  PCP Hospital f/u appt confirmed? Scheduled to see Dr. Lockie Pares on 01/05/22 @ 3:45pm.   Specialist Hospital f/u appt confirmed? Scheduled to see Dr. Molli Hazard on 07/24/21 @ Cardiology.   If their condition worsens, is the pt aware to call PCP or go to the Emergency Dept.? Yes  Final Patient Assessment: Patient has f/u scheduled and knows to get refills sent to home pharmacy

## 2021-07-31 ENCOUNTER — Other Ambulatory Visit: Payer: Self-pay

## 2021-07-31 ENCOUNTER — Telehealth: Payer: Self-pay | Admitting: Cardiovascular Disease

## 2021-07-31 DIAGNOSIS — R0602 Shortness of breath: Secondary | ICD-10-CM

## 2021-07-31 MED ORDER — ATORVASTATIN CALCIUM 80 MG PO TABS: 80.0000 mg | ORAL_TABLET | Freq: Every day | ORAL | 3 refills | Status: DC

## 2021-07-31 MED ORDER — ASPIRIN 81 MG PO CHEW: 81.0000 mg | CHEWABLE_TABLET | Freq: Every day | ORAL | 3 refills | Status: DC

## 2021-07-31 MED ORDER — TICAGRELOR 90 MG PO TABS: 90.0000 mg | ORAL_TABLET | Freq: Two times a day (BID) | ORAL | 3 refills | Status: DC

## 2021-07-31 MED ORDER — METOPROLOL TARTRATE 25 MG PO TABS: 12.5000 mg | ORAL_TABLET | Freq: Two times a day (BID) | ORAL | 3 refills | Status: DC

## 2021-07-31 NOTE — Telephone Encounter (Signed)
°*  STAT* If patient is at the pharmacy, call can be transferred to refill team.   1. Which medications need to be refilled? (please list name of each medication and dose if known) aspirin 81 MG chewable tablet  ticagrelor (BRILINTA) 90 MG TABS tablet  atorvastatin (LIPITOR) 80 MG tablet  metoprolol tartrate (LOPRESSOR) 25 MG tablet  2. Which pharmacy/location (including street and city if local pharmacy) is medication to be sent to? [facility] ([facility] ) - [place], [place] - [address]  3. Do they need a 30 day or 90 day supply? 90

## 2021-07-31 NOTE — Telephone Encounter (Signed)
Eric Asters, NP  You 7 minutes ago (4:27 PM)   Please contact [name] and let him know that we will order a CBC.  His hemoglobin was slightly decreased at his discharge.  If his hemoglobin has returned to normal we will have him return to work without restrictions on 08/04/2021.  Please have him present for blood draw on 08/01/2021.  Thank you.    Made patient aware, he will have labs done tomorrow. Patient states he is still concerned about returning to work because "he know that his heart function is good" but states that it's his stamina that he is concerned about and states that he can not use his vacation days and feels he will not be ready by then to return. Made patient aware I will forward message to [place] again, and will follow up after CBC results.   Order placed for CBC

## 2021-07-31 NOTE — Telephone Encounter (Signed)
Patient calling too say that he still out of breath really bad and calling to see if can be written out of work longer. Asking if it can be extended till 3/13 to get his breathing back right. Please advise

## 2021-07-31 NOTE — Telephone Encounter (Signed)
Called patient to advise refills sent to pharmacy. ?

## 2021-07-31 NOTE — Telephone Encounter (Signed)
Returned call to patient who states that he has been having trouble getting his stamina back up after his heart cath. Patient states that he has started back walking and trying to move more and notices that he gets short of breath with this and has a hard time catching his breath afterward. Patient states that he feels like he is wearing out very easily. Patient denies any chest pain or swelling. Patient reports he has occasional dizziness with standing but it resolves quickly. Patient states that he will be going back to work soon and feels that he is not ready for this. Patient would like an extension and note to stay out of work until 3/13 if able. Patient feels this will give him time to get his stamina back up. Advised patient I would forward message to Dr. Tresa Endo and Edd Fabian, NP for them to review and advise.   Also, Made patient aware of ED precautions should new or worsening symptoms develop. Patient verbalized understanding.

## 2021-08-01 NOTE — Telephone Encounter (Signed)
Discuss this with Edd Fabian, FNP-C he states that pt needs to have lab done before we can change anything.  Returned call to pt and informed him that I spoke with Edd Fabian, FM NP-C we need to go to the lab and have labwork done before we can help. Pt states that he "is not going back to the lab today"

## 2021-08-01 NOTE — Telephone Encounter (Signed)
Patient following up. States yesterday when he spoke with the nurse he was ensured that his orders will be attainable by any [facility] patient service center, but he states today he went to have the labs drawn this morning and they do not have any orders for him. He states he prefers going to a patient service center through [facility] because it is less expensive for him. He would like to have orders sent directly to [facility] if possible and a call back to confirm when this is completed.

## 2021-08-01 NOTE — Telephone Encounter (Signed)
Returned call to pt he states that he was unable to have lab drawn this morning and he was assured that he would be able to go to any [facility] to have this done. Informed pt that he should call when at the [facility] so we could try to see or Fax over the orders while he is there. He states that he did not do this because he was not told to. He states that he is unable to go have this done now. He also states that his back to work letter is incorrect, he states that he spoke with jesse at the visit and was told that he could only work 8 hours but the letter says no restrictions. He is to go back to work Monday and "needs this fixed before then"

## 2021-08-05 NOTE — Telephone Encounter (Signed)
Checked chart-still no lab drawn pt also has not called and let us know which lab he is at.

## 2021-08-19 NOTE — Telephone Encounter (Signed)
07/24/2021  ?Forms faxed - Completed ?

## 2021-09-15 ENCOUNTER — Other Ambulatory Visit: Payer: Self-pay | Admitting: Cardiovascular Disease

## 2021-09-16 ENCOUNTER — Telehealth: Payer: Self-pay | Admitting: Cardiovascular Disease

## 2021-09-16 ENCOUNTER — Telehealth: Payer: Self-pay | Admitting: General Practice

## 2021-09-16 MED ORDER — TICAGRELOR 90 MG PO TABS: 90.0000 mg | ORAL_TABLET | Freq: Two times a day (BID) | ORAL | 3 refills | Status: DC

## 2021-09-16 MED ORDER — METOPROLOL TARTRATE 25 MG PO TABS: 12.5000 mg | ORAL_TABLET | Freq: Two times a day (BID) | ORAL | 3 refills | Status: DC

## 2021-09-16 MED ORDER — ATORVASTATIN CALCIUM 80 MG PO TABS: 80.0000 mg | ORAL_TABLET | Freq: Every day | ORAL | 3 refills | Status: DC

## 2021-09-16 MED ORDER — ATORVASTATIN CALCIUM 80 MG PO TABS: 80.0000 mg | ORAL_TABLET | Freq: Every day | ORAL | 0 refills | Status: DC

## 2021-09-16 MED ORDER — ASPIRIN 81 MG PO CHEW: 81.0000 mg | CHEWABLE_TABLET | Freq: Every day | ORAL | 3 refills | Status: DC

## 2021-09-16 NOTE — Telephone Encounter (Signed)
Spoke with patient. Advised that lab orders will be printed/mailed - CBC, LFT, lipid panel. Notified he will need to be fasting.  ?

## 2021-09-16 NOTE — Telephone Encounter (Signed)
Spoke with Patient. Patient was out of medication. Sent a 30 day supply of atorvastatin to local pharmacy and a 90 supply to mail order.  ?

## 2021-09-16 NOTE — Telephone Encounter (Signed)
? ?*  STAT* If patient is at the pharmacy, call can be transferred to refill team. ? ? ?1. Which medications need to be refilled? (please list name of each medication and dose if known) atorvastatin (LIPITOR) 80 MG tablet ? ?2. Which pharmacy/location (including street and city if local pharmacy) is medication to be sent to? [facility],  [address], [place], [place] [address] ?Phone: [phone] ? ?3. Do they need a 30 day or 90 day supply? 90 days  ? ?Pt needs refill today, he's out of meds. [facility] because his insurance does not cover [facility] prescription at [facility]. However, [facility] said they did not receive this prescription. Please resend to [facility]  ?

## 2021-09-16 NOTE — Telephone Encounter (Signed)
?  Pt is asking if he can get a paper order for his lab works so he can get it done at [facility] in [place]. He said, he just wanted to go to any location he's at since he work at [facility] ?

## 2021-10-21 ENCOUNTER — Other Ambulatory Visit: Payer: Self-pay | Admitting: General Practice

## 2021-10-22 LAB — CBC
Hematocrit: 46.1 % (ref 37.5–51.0)
Hemoglobin: 15.3 g/dL (ref 13.0–17.7)
MCH: 27.8 pg (ref 26.6–33.0)
MCHC: 33.2 g/dL (ref 31.5–35.7)
MCV: 84 fL (ref 79–97)
Platelets: 358 10*3/uL (ref 150–450)
RBC: 5.51 x10E6/uL (ref 4.14–5.80)
RDW: 13.9 % (ref 11.6–15.4)
WBC: 7 10*3/uL (ref 3.4–10.8)

## 2021-10-22 LAB — LIPID PANEL
Chol/HDL Ratio: 4.4 ratio (ref 0.0–5.0)
Cholesterol, Total: 172 mg/dL (ref 100–199)
HDL: 39 mg/dL — ABNORMAL LOW (ref >39)
LDL Chol Calc (NIH): 94 mg/dL (ref 0–99)
Triglycerides: 230 mg/dL — ABNORMAL HIGH (ref 0–149)
VLDL Cholesterol Cal: 39 mg/dL (ref 5–40)

## 2021-10-23 ENCOUNTER — Other Ambulatory Visit: Payer: Self-pay

## 2021-10-23 DIAGNOSIS — I2111 ST elevation (STEMI) myocardial infarction involving right coronary artery: Secondary | ICD-10-CM

## 2021-10-23 DIAGNOSIS — Z79899 Other long term (current) drug therapy: Secondary | ICD-10-CM

## 2021-10-23 DIAGNOSIS — E782 Mixed hyperlipidemia: Secondary | ICD-10-CM

## 2021-10-23 MED ORDER — FENOFIBRATE 120 MG PO TABS: 120.0000 mg | ORAL_TABLET | Freq: Every day | ORAL | 6 refills | Status: DC

## 2021-10-29 ENCOUNTER — Encounter: Payer: Self-pay | Admitting: Cardiovascular Disease

## 2021-10-29 ENCOUNTER — Ambulatory Visit (INDEPENDENT_AMBULATORY_CARE_PROVIDER_SITE_OTHER): Payer: Managed Care, Other (non HMO) | Admitting: Cardiovascular Disease

## 2021-10-29 DIAGNOSIS — Z9861 Coronary angioplasty status: Secondary | ICD-10-CM | POA: Diagnosis not present

## 2021-10-29 DIAGNOSIS — Z79899 Other long term (current) drug therapy: Secondary | ICD-10-CM

## 2021-10-29 DIAGNOSIS — E782 Mixed hyperlipidemia: Secondary | ICD-10-CM

## 2021-10-29 DIAGNOSIS — I2111 ST elevation (STEMI) myocardial infarction involving right coronary artery: Secondary | ICD-10-CM

## 2021-10-29 MED ORDER — EZETIMIBE 10 MG PO TABS: 10.0000 mg | ORAL_TABLET | Freq: Every day | ORAL | 6 refills | Status: DC

## 2021-10-29 MED ORDER — ICOSAPENT ETHYL 1 G PO CAPS: 2.0000 g | ORAL_CAPSULE | Freq: Two times a day (BID) | ORAL | 6 refills | Status: DC

## 2021-10-29 NOTE — Progress Notes (Signed)
Cardiology Office Note    Date:  11/05/2021   ID:  Eric Anderson, DOB May 07, 1961, MRN [id number]  PCP:  Maryland Pink, MD  Cardiologist:  Shelva Majestic, MD   Initial office visit with me following his acute MI on July 12, 2021   History of Present Illness:  Eric Anderson is a 61 y.o. male who presented to [facility] July 12, 2021 with transfer from [facility] ER where he presented with inferolateral ST segment elevation myocardial infarction.  At that time the Cath Lab at [facility] was occupied and he was transferred for: Hospital.  Upon arrival he was taken emergently to the cardiac catheterization laboratory where I performed cardiac catheterization and successful PCI to a large dominant totally occluded proximal RCA with insertion of a 3.5 x 26 mm Medtronic Onyx frontiers stent.  There was significant thrombus burden for which Aggrastat was started.  With initial reperfusion he became bradycardic with junctional rhythm treated with atropine and due to hypotension transiently required Levophed in addition to IV fluid bolus.  He also at the time underwent PTCA of a totally occluded large PLA branch with restoration of TIMI-3 flow.  At the time of the catheterization he also had high-grade complex proximal and mid trifurcation LAD stenoses in addition to 70% circumflex stenosis.  Two days later on July 14, 2021 he underwent successful staged PCI with scoring balloon and insertion of a 2.5 x 34 mm Medtronic Onyx frontiers stent to cover both LAD lesions.  There was an 80% ostial diagonal stenosis which was not intervened upon.  His circumflex lesion was treated medically.  An echo Doppler study on July 12, 2021 showed EF at 50 to 55% with moderately reduced RV function consistent with a component of RV infarction secondary to his large RCA occlusion.  Subsequently, he did well and was discharged on July 15, 2021 on aspirin/Brilinta for DAPT, atorvastatin 80 mg, metoprolol  tartrate 12.5 mg twice a day, in addition to his prior Effexor XR and finasteride.  He was evaluated by Coletta Memos, NP on July 24, 2021 for his post MI initial evaluation and denied any recurrent chest pain symptomatology.  Presently, he continues to feel well.  He denies any chest pain or shortness of breath.  He works at [employer].  Laboratory on Oct 21, 2021 showed total cholesterol 172, triglycerides 230, HDL 39, and LDL 94 despite taking atorvastatin 80 mg and fenofibrate 120 mg.  He continues to be on DAPT with aspirin/Brilinta.  He continues to be on metoprolol 12.5 mg twice a day.  He presents for evaluation.   Past Medical History:  Diagnosis Date   History of BPH    Hyperlipidemia    Urine retention     Past Surgical History:  Procedure Laterality Date   ANTERIOR FUSION CERVICAL SPINE     COLONOSCOPY WITH PROPOFOL N/A 05/15/2020   Procedure: COLONOSCOPY WITH PROPOFOL;  Surgeon: Toledo, Benay Pike, MD;  Location: [facility] ENDOSCOPY;  Service: Gastroenterology;  Laterality: N/A;   CORONARY STENT INTERVENTION N/A 07/14/2021   Procedure: CORONARY STENT INTERVENTION;  Surgeon: Troy Sine, MD;  Location: [place] CV LAB;  Service: Cardiovascular;  Laterality: N/A;   CORONARY/GRAFT ACUTE MI REVASCULARIZATION N/A 07/12/2021   Procedure: Coronary/Graft Acute MI Revascularization;  Surgeon: Troy Sine, MD;  Location: [place] CV LAB;  Service: Cardiovascular;  Laterality: N/A;   LAPAROSCOPIC APPENDECTOMY N/A 02/20/2015   Procedure: APPENDECTOMY LAPAROSCOPIC;  Surgeon: Dia Crawford III, MD;  Location: [facility] ORS;  Service: General;  Laterality: N/A;   LEFT HEART CATH N/A 07/14/2021   Procedure: Left Heart Cath;  Surgeon: Troy Sine, MD;  Location: [place] [facility] LAB;  Service: Cardiovascular;  Laterality: N/A;   LEFT HEART CATH AND CORONARY ANGIOGRAPHY N/A 07/12/2021   Procedure: LEFT HEART CATH AND CORONARY ANGIOGRAPHY;  Surgeon: Troy Sine, MD;  Location: [place] CV LAB;   Service: Cardiovascular;  Laterality: N/A;    Current Medications: Outpatient Medications Prior to Visit  Medication Sig Dispense Refill   aspirin 81 MG chewable tablet Chew 1 tablet (81 mg total) by mouth daily. 90 tablet 3   atorvastatin (LIPITOR) 80 MG tablet Take 1 tablet (80 mg total) by mouth daily. 30 tablet 0   B Complex-C (SUPER B COMPLEX PO) Take 1 tablet by mouth daily.     finasteride (PROSCAR) 5 MG tablet Take 5 mg by mouth daily.     Flaxseed, Linseed, (FLAXSEED OIL PO) Take 1 capsule by mouth 3 (three) times daily.     ibuprofen (ADVIL,MOTRIN) 200 MG tablet Take 400 mg by mouth every 6 (six) hours as needed for mild pain.     metoprolol tartrate (LOPRESSOR) 25 MG tablet Take 1/2 tablets (12.5 mg total) by mouth 2 (two) times daily. 90 tablet 3   nitroGLYCERIN (NITROSTAT) 0.4 MG SL tablet Place 1 tablet (0.4 mg total) under the tongue every 5 (five) minutes as needed for chest pain. For up to 3 doses 25 tablet 4   omeprazole (PRILOSEC) 20 MG capsule Take 20 mg by mouth daily.     sildenafil (VIAGRA) 100 MG tablet Take by mouth.     ticagrelor (BRILINTA) 90 MG TABS tablet Take 1 tablet (90 mg total) by mouth 2 (two) times daily. 180 tablet 3   venlafaxine XR (EFFEXOR-XR) 150 MG 24 hr capsule Take 150 mg by mouth daily with breakfast.     Fenofibrate 120 MG TABS Take 1 tablet (120 mg total) by mouth daily. 30 tablet 6   atorvastatin (LIPITOR) 80 MG tablet Take 1 tablet (80 mg total) by mouth daily. (Patient not taking: Reported on 10/29/2021) 90 tablet 3   ondansetron (ZOFRAN ODT) 4 MG disintegrating tablet Take 1 tablet (4 mg total) by mouth every 8 (eight) hours as needed for nausea or vomiting. (Patient not taking: Reported on 10/29/2021) 20 tablet 0   No facility-administered medications prior to visit.     Allergies:   Patient has no known allergies.   Social History   Socioeconomic History   Marital status: Married    Spouse name: Not on file   Number of children: Not  on file   Years of education: Not on file   Highest education level: Not on file  Occupational History   Not on file  Tobacco Use   Smoking status: Never   Smokeless tobacco: Never  Vaping Use   Vaping Use: Never used  Substance and Sexual Activity   Alcohol use: Yes    Alcohol/week: 0.0 standard drinks    Comment: occasional   Drug use: No   Sexual activity: Not on file  Other Topics Concern   Not on file  Social History Narrative   Not on file   Social Determinants of Health   Financial Resource Strain: Not on file  Food Insecurity: Not on file  Transportation Needs: Not on file  Physical Activity: Not on file  Stress: Not on file  Social Connections: Not on file  Family History:  The patient's family history includes Diabetes in his brother and sister; Heart disease in his father.   ROS General: Negative; No fevers, chills, or night sweats;  HEENT: Negative; No changes in vision or hearing, sinus congestion, difficulty swallowing Pulmonary: Negative; No cough, wheezing, shortness of breath, hemoptysis Cardiovascular: Negative; No chest pain, presyncope, syncope, palpitations GI: Negative; No nausea, vomiting, diarrhea, or abdominal pain GU: Negative; No dysuria, hematuria, or difficulty voiding Musculoskeletal: Negative; no myalgias, joint pain, or weakness Hematologic/Oncology: Negative; no easy bruising, bleeding Endocrine: Negative; no heat/cold intolerance; no diabetes Neuro: Negative; no changes in balance, headaches Skin: Negative; No rashes or skin lesions Psychiatric: Negative; No behavioral problems, depression Sleep: Negative; No snoring, daytime sleepiness, hypersomnolence, bruxism, restless legs, hypnogognic hallucinations, no cataplexy Other comprehensive 14 point system review is negative.   PHYSICAL EXAM:   VS:  BP 116/70   Pulse 62   Ht '5\' 9"'  (1.753 m)   Wt 204 lb 12.8 oz (92.9 kg)   SpO2 98%   BMI 30.24 kg/m     Repeat blood pressure  by me 160/70  Wt Readings from Last 3 Encounters:  10/29/21 204 lb 12.8 oz (92.9 kg)  07/24/21 203 lb (92.1 kg)  07/12/21 209 lb 14.1 oz (95.2 kg)    General: Alert, oriented, no distress.  Skin: normal turgor, no rashes, warm and dry HEENT: Normocephalic, atraumatic. Pupils equal round and reactive to light; sclera anicteric; extraocular muscles intact;  Nose without nasal septal hypertrophy Mouth/Parynx benign; Mallinpatti scale Neck: No JVD, no carotid bruits; normal carotid upstroke Lungs: clear to ausculatation and percussion; no wheezing or rales Chest wall: without tenderness to palpitation Heart: PMI not displaced, RRR, s1 s2 normal, 1/6 systolic murmur, no diastolic murmur, no rubs, gallops, thrills, or heaves Abdomen: soft, nontender; no hepatosplenomehaly, BS+; abdominal aorta nontender and not dilated by palpation. Back: no CVA tenderness Pulses 2+ Musculoskeletal: full range of motion, normal strength, no joint deformities Extremities: no clubbing cyanosis or edema, Homan's sign negative  Neurologic: grossly nonfocal; Cranial nerves grossly wnl Psychologic: Normal mood and affect   Studies/Labs Reviewed:   Oct 29, 2021 ECG (independently read by me): NSR at 62, Inferior MI, no ectopy, normal intervals  Recent Labs:    Latest Ref Rng & Units 07/15/2021    1:03 AM 07/14/2021    2:39 AM 07/13/2021   12:30 AM  BMP  Glucose 70 - 99 mg/dL 101   122   133    BUN 6 - 20 mg/dL '12   10   10    ' Creatinine 0.61 - 1.24 mg/dL 0.96   0.81   0.83    Sodium 135 - 145 mmol/L 139   136   136    Potassium 3.5 - 5.1 mmol/L 4.5   3.9   3.9    Chloride 98 - 111 mmol/L 109   107   106    CO2 22 - 32 mmol/L '18   21   21    ' Calcium 8.9 - 10.3 mg/dL 8.6   8.5   8.2          Latest Ref Rng & Units 07/12/2021    1:26 PM 02/24/2015   10:02 PM 02/21/2015    7:11 AM  Hepatic Function  Total Protein 6.5 - 8.1 g/dL 6.6   7.4   6.6    Albumin 3.5 - 5.0 g/dL 3.9   3.8   3.4    AST 15 - 41 U/L  32   23   22    ALT 0 - 44 U/L 34   25   16    Alk Phosphatase 38 - 126 U/L 68   62   59    Total Bilirubin 0.3 - 1.2 mg/dL 0.8   0.9   1.2         Latest Ref Rng & Units 10/21/2021   10:07 AM 07/15/2021    1:03 AM 07/14/2021    2:39 AM  CBC  WBC 3.4 - 10.8 x10E3/uL 7.0   13.5   14.0    Hemoglobin 13.0 - 17.7 g/dL 15.3   11.7   12.4    Hematocrit 37.5 - 51.0 % 46.1   35.5   37.1    Platelets 150 - 450 x10E3/uL 358   292   282     Lab Results  Component Value Date   MCV 84 10/21/2021   MCV 84.3 07/15/2021   MCV 83.6 07/14/2021   No results found for: TSH Lab Results  Component Value Date   HGBA1C 5.6 07/12/2021     BNP No results found for: BNP  ProBNP No results found for: PROBNP   Lipid Panel     Component Value Date/Time   CHOL 172 10/21/2021 1007   TRIG 230 (H) 10/21/2021 1007   HDL 39 (L) 10/21/2021 1007   CHOLHDL 4.4 10/21/2021 1007   CHOLHDL 5.9 07/12/2021 1326   VLDL 18 07/12/2021 1326   LDLCALC 94 10/21/2021 1007   LABVLDL 39 10/21/2021 1007     RADIOLOGY: No results found.   Additional studies/ records that were reviewed today include:    CATH/PCI: 07/12/2021   Mid LAD-1 lesion is 80% stenosed with 80% stenosed side branch in 2nd Sept.   Mid LAD-2 lesion is 70% stenosed.   Mid Cx to Dist Cx lesion is 70% stenosed.   2nd Diag lesion is 80% stenosed.   A drug-eluting stent was successfully placed.   Post intervention, there is a 0% residual stenosis.   Post intervention, there is a 0% residual stenosis.   Post intervention, the side branch was reduced to 0% residual stenosis.   Successful PCI involving complex stenosis in the proximal LAD at the takeoff of the septal and diagonal vessel with 80% ostial diagonal stenosis with a sharp angled segment and 70% mid stenosis of the LAD.  A score flex balloon was initially used at the complex LAD stenosis involving the trifurcation of septal and diagonal vessel to reduce potential for plaque shifting.   Following insertion of a 2.5 x 34 mm Medtronic Onyx Frontier stent postdilated to 2.84 proximally to 2.72 distally, the stenosis was reduced to 0%.  At the completion of the procedure there was TIMI-3 flow down the LAD as well as the diagonal vessel but there continued to be 80-85% ostial diagonal stenosis prior to the sharp bend in the vessel.    RECOMMENDATION: In this patient who is status post acute PCI to a totally occluded RCA treated successfully on July 11, 2021 recommend DAPT for minimum of 12 months but probably indefinitely.  Increase medical therapy for concomitant CAD with  chronic occlusion of the circumflex and residual diagonal disease.    Intervention     ECHO: 07/13/2021  1. Mid / basal inferior hypokinesis . Left ventricular ejection fraction,  by estimation, is 50 to 55%. The left ventricle has low normal function.  The left ventricle demonstrates regional wall motion abnormalities (see  scoring  diagram/findings for  description). There is moderate left ventricular hypertrophy. Left  ventricular diastolic parameters were normal.   2. ? RV infarction . Right ventricular systolic function is moderately  reduced. The right ventricular size is moderately enlarged.   3. Right atrial size was mildly dilated.   4. The mitral valve is abnormal. Trivial mitral valve regurgitation. No  evidence of mitral stenosis.   5. The aortic valve is tricuspid. There is mild calcification of the  aortic valve. Aortic valve regurgitation is not visualized. Aortic valve  sclerosis is present, with no evidence of aortic valve stenosis.   6. The inferior vena cava is dilated in size with >50% respiratory  variability, suggesting right atrial pressure of 8 mmHg.   ASSESSMENT:    1. ST elevation myocardial infarction involving right coronary artery ([place]): 07/12/2021   2. Staged percutaneous coronary intervention to LAD: 07/14/2021   3. Mixed hyperlipidemia   4. Medication management      PLAN:  Eric Anderson is a 61 year old gentleman who developed an acute inferolateral large myocardial infarction in July 12, 2021.  He was transferred to [facility] doing 2 and occupied Cath Lab at [facility] and was found to have total proximal RCA occlusion..  Also was extensive thrombus burden and total occlusion of a large distal PLA branch.  He underwent successful initial intervention with stenting of his proximal to mid RCA and PTCA of his PLA branch.  2 days later he underwent staged intervention to a complex LAD trifurcation stenosis with successful intervention with initial score flex balloon dilatation followed by insertion of 2.5 x 34 mm Medtronic Onyx frontiers stent.  At the completion of the procedure he had brisk TIMI-3 flow down the LAD and diagonal in his diagonal was unchanged with the 80% ostial stenosis prior to the sharp bend in the vessel.  He also had concomitant 70% distal circumflex stenosis.  Subsequently, he has been without anginal symptomatology.  He continues to be active and works at [employer].  He continues to be on DAPT.  Lipid studies on May 16 showed total cholesterol 172, triglycerides 230, HDL 39 and LDL 94.  I am adding Vascepa 2 g twice a day to his current regimen of atorvastatin 80 mg and fenofibrate 120 mg and I am also adding Zetia 10 mg daily.  If he cannot reach target LDL less than 70 and preferably less than 55 he will be a candidate for PCSK9 inhibition.  In 3 months I am recommending he undergo an echo Doppler evaluation for follow-up assessment of LV function.  I will also recheck laboratory with a comprehensive metabolic panel, fasting lipid panel as well as an LP(a).  I will see him in 4 months for reevaluation.   Medication Adjustments/Labs and Tests Ordered: Current medicines are reviewed at length with the patient today.  Concerns regarding medicines are outlined above.  Medication changes, Labs and Tests ordered today are listed in the Patient  Instructions below. Patient Instructions  Medication Instructions:  Stop Fenofibrate  Start Vascepa 2gm twice daily (2 cap twice daily)  Start Zetia 48m daily  *If you need a refill on your cardiac medications before your next appointment, please call your pharmacy*  Lab Work:    Fasting lipid, Lpa and CMET IN 3 MONTHS If you have labs (blood work) drawn today and your tests are completely normal, you will receive your results only by: MLinnell Camp(if you have MyChart) OR  A paper copy in the mail If  you have any lab test that is abnormal or we need to change your treatment, we will call you to review the results.  Testing/Procedures:  Echocardiogram - Your physician has requested that you have an echocardiogram. Echocardiography is a painless test that uses sound waves to create images of your heart. It provides your doctor with information about the size and shape of your heart and how well your heart's chambers and valves are working. This procedure takes approximately one hour. There are no restrictions for this procedure.   Special Instructions NO EXTREME DENTAL PROCEDURES FOR 6-12 MONTHS, CLEANING IS OK  Follow-Up: Your next appointment:  4 month(s) In Person with Shelva Majestic, MD     At [facility], you and your health needs are our priority.  As part of our continuing mission to provide you with exceptional heart care, we have created designated Provider Care Teams.  These Care Teams include your primary Cardiologist (physician) and Advanced Practice Providers (APPs -  Physician Assistants and Nurse Practitioners) who all work together to provide you with the care you need, when you need it.    Important Information About Sugar             Signed, Shelva Majestic, MD  11/05/2021 8:57 AM    [facility] [address] [place], [place], [place]  [address] Phone: [phone]

## 2021-10-29 NOTE — Patient Instructions (Addendum)
Medication Instructions:  Stop Fenofibrate  Start Vascepa 2gm twice daily (2 cap twice daily)  Start Zetia 10mg  daily  *If you need a refill on your cardiac medications before your next appointment, please call your pharmacy*  Lab Work:    Fasting lipid, Lpa and CMET IN 3 MONTHS If you have labs (blood work) drawn today and your tests are completely normal, you will receive your results only by: MyChart Message (if you have MyChart) OR  A paper copy in the mail If you have any lab test that is abnormal or we need to change your treatment, we will call you to review the results.  Testing/Procedures:  Echocardiogram - Your physician has requested that you have an echocardiogram. Echocardiography is a painless test that uses sound waves to create images of your heart. It provides your doctor with information about the size and shape of your heart and how well your heart's chambers and valves are working. This procedure takes approximately one hour. There are no restrictions for this procedure.   Special Instructions NO EXTREME DENTAL PROCEDURES FOR 6-12 MONTHS, CLEANING IS OK  Follow-Up: Your next appointment:  4 month(s) In Person with , MD     At [facility], you and your health needs are our priority.  As part of our continuing mission to provide you with exceptional heart care, we have created designated Provider Care Teams.  These Care Teams include your primary Cardiologist (physician) and Advanced Practice Providers (APPs -  Physician Assistants and Nurse Practitioners) who all work together to provide you with the care you need, when you need it.    Important Information About Sugar

## 2021-11-05 ENCOUNTER — Encounter: Payer: Self-pay | Admitting: Cardiovascular Disease

## 2021-11-06 ENCOUNTER — Telehealth: Payer: Self-pay

## 2021-11-06 ENCOUNTER — Other Ambulatory Visit: Payer: Self-pay

## 2021-11-06 NOTE — Telephone Encounter (Signed)
Received fax - PA needed for Vascepa.   KEY: [id number] Last name: Eric Anderson DOB: 1961/02/13  Thank you!

## 2021-11-07 NOTE — Telephone Encounter (Signed)
**Note De-Identified Jalal Rauch Obfuscation** Earlin Sweeden Key: [id number] Outcome: Approved today ICOSAPENT CAP 1GM is approved through 11/08/2022.  Drug: Icosapent Ethyl 1GM capsules Form: [employer] Electronic Prior Authorization Form (2017 NCPDP)  I have notified [id number] - [place], [place] - [address] (Ph: [phone]) of this approval.

## 2021-11-07 NOTE — Telephone Encounter (Signed)
**Note De-Identified Roshonda Sperl Obfuscation** Vasepa PA started through covermymeds. Key: [id number]

## 2021-11-21 ENCOUNTER — Encounter: Payer: Self-pay | Admitting: Cardiovascular Disease

## 2021-11-21 NOTE — Progress Notes (Signed)
Unable to contact patient to schedule echocardiogram, letter sent, order cancelled. 

## 2021-12-02 ENCOUNTER — Other Ambulatory Visit (HOSPITAL_COMMUNITY): Payer: Self-pay

## 2022-01-06 ENCOUNTER — Other Ambulatory Visit (HOSPITAL_COMMUNITY): Payer: Self-pay

## 2022-02-10 ENCOUNTER — Other Ambulatory Visit (HOSPITAL_COMMUNITY)
Admission: RE | Admit: 2022-02-10 | Discharge: 2022-02-10 | Disposition: A | Discharge status: Home / Self Care | Payer: Managed Care, Other (non HMO) | Source: Ambulatory Visit | Attending: Family Medicine | Admitting: Family Medicine

## 2022-02-10 DIAGNOSIS — N898 Other specified noninflammatory disorders of vagina: Secondary | ICD-10-CM | POA: Insufficient documentation

## 2022-02-26 ENCOUNTER — Other Ambulatory Visit: Payer: Self-pay

## 2022-02-26 MED ORDER — EZETIMIBE 10 MG PO TABS: 10.0000 mg | ORAL_TABLET | Freq: Every day | ORAL | 6 refills | Status: DC

## 2022-02-26 MED ORDER — ICOSAPENT ETHYL 1 G PO CAPS: 2.0000 g | ORAL_CAPSULE | Freq: Two times a day (BID) | ORAL | 6 refills | Status: DC

## 2022-02-27 ENCOUNTER — Other Ambulatory Visit: Payer: Self-pay

## 2022-02-27 MED ORDER — ICOSAPENT ETHYL 1 G PO CAPS: 2.0000 g | ORAL_CAPSULE | Freq: Two times a day (BID) | ORAL | 6 refills | Status: DC

## 2022-03-19 ENCOUNTER — Ambulatory Visit: Payer: Managed Care, Other (non HMO) | Admitting: Cardiovascular Disease

## 2022-06-06 IMAGING — DX DG CHEST 1V PORT
1 series · 2 of 2 positions shown · non-contrast
Comparison: 07/12/2021

CLINICAL DATA: Chest pain, diaphoresis

EXAM:
PORTABLE CHEST 1 VIEW

[Series 1: chest ap · 0.14mm/px · 2 of 2 slices shown]
[im 1/2]
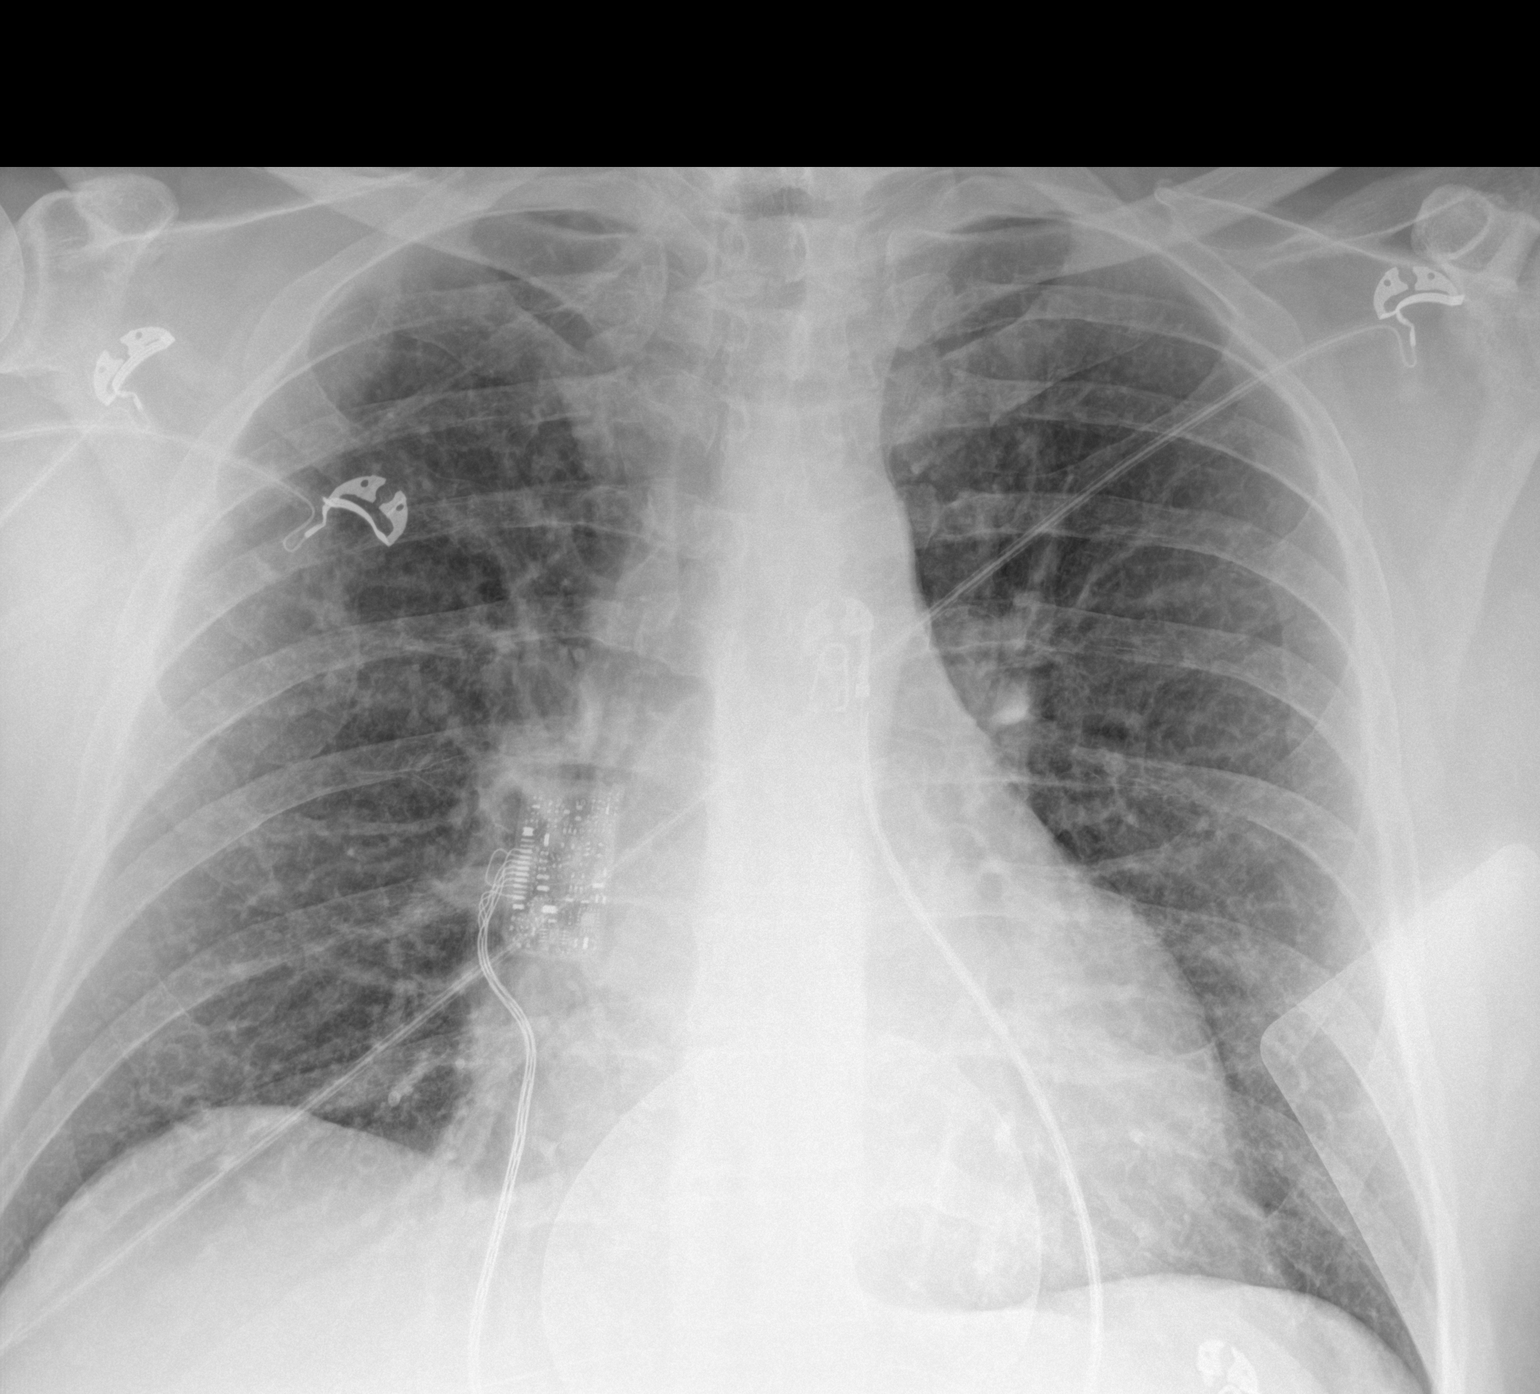
[im 2/2]
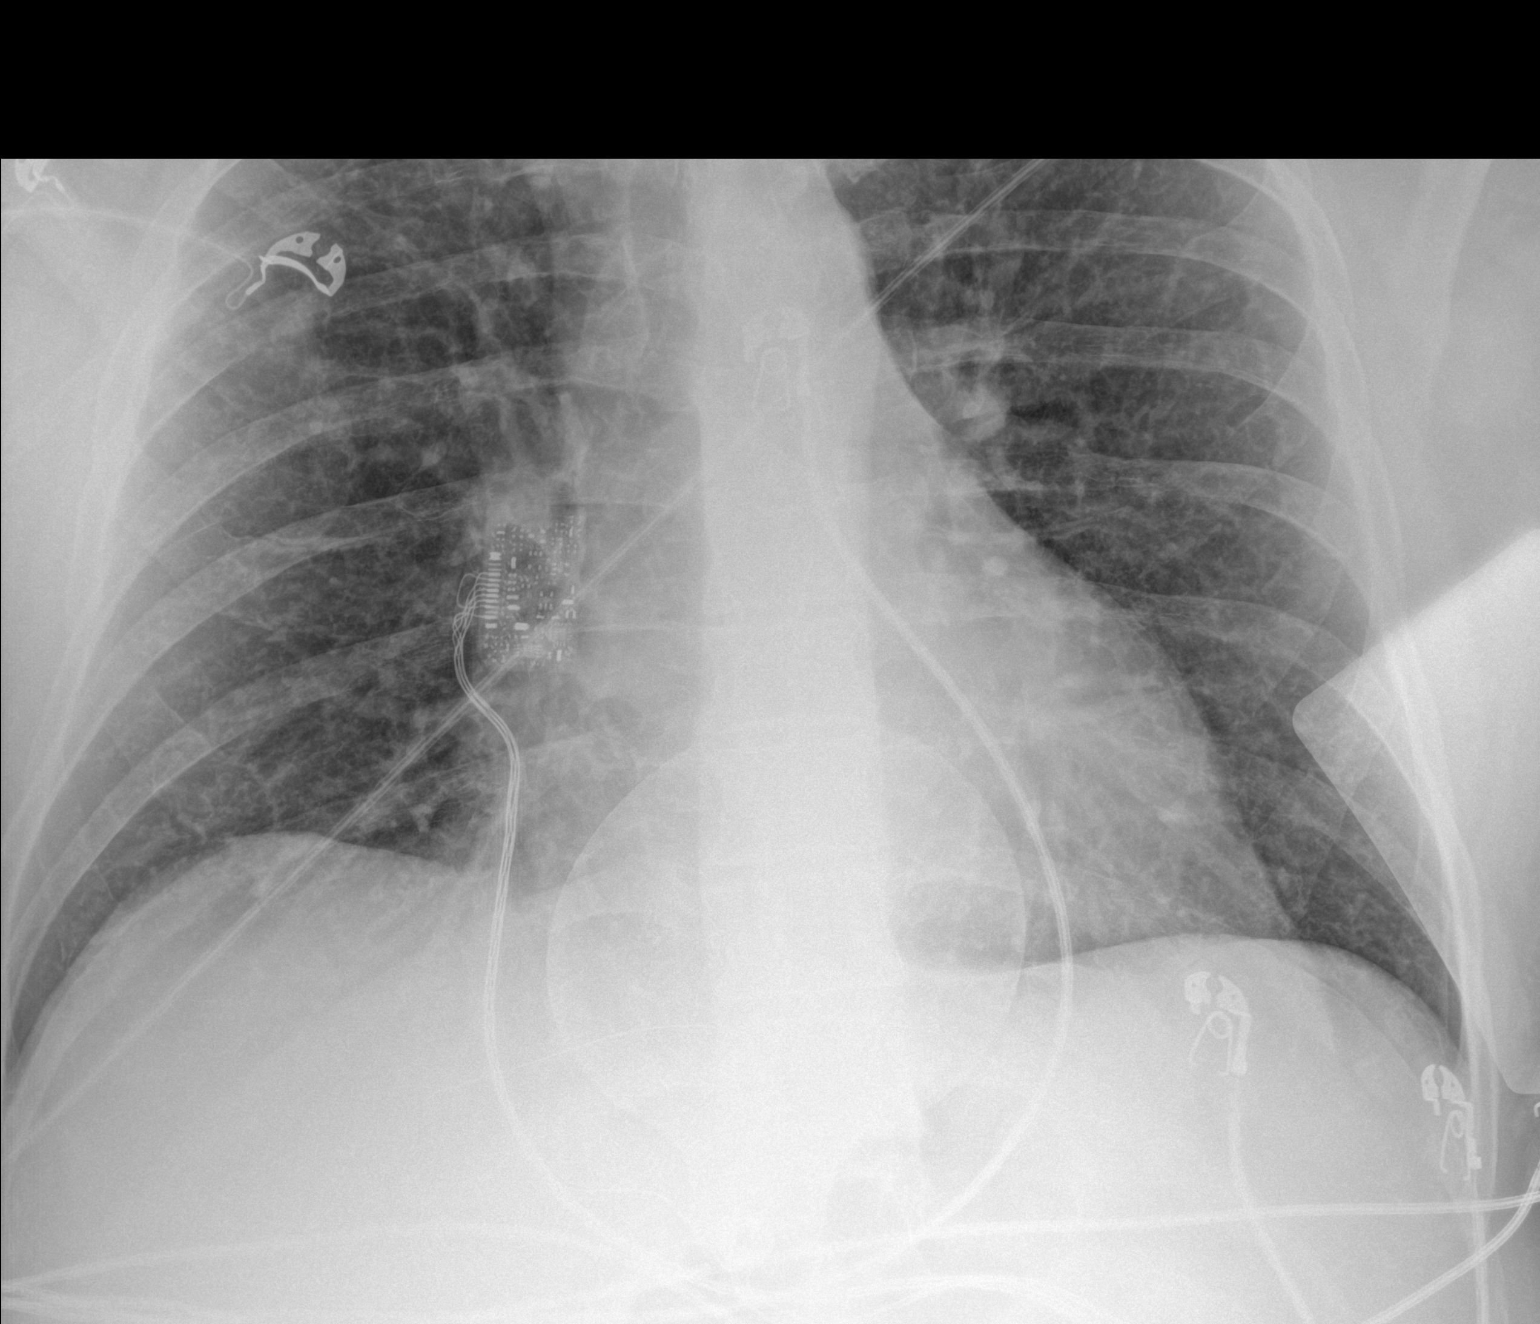

[2 of 2 positions shown; findings below may reference images not displayed]

FINDINGS: Mild bilateral interstitial thickening. No focal consolidation. No
pleural effusion or pneumothorax. Heart and mediastinal contours are
unremarkable.

No acute osseous abnormality.
IMPRESSION: 1. Bilateral interstitial thickening concerning for mild
interstitial edema.

## 2022-07-05 ENCOUNTER — Other Ambulatory Visit: Payer: Self-pay | Admitting: Cardiovascular Disease

## 2022-07-13 ENCOUNTER — Other Ambulatory Visit: Payer: Self-pay

## 2022-07-13 ENCOUNTER — Telehealth: Payer: Self-pay | Admitting: Cardiovascular Disease

## 2022-07-13 DIAGNOSIS — I2111 ST elevation (STEMI) myocardial infarction involving right coronary artery: Secondary | ICD-10-CM

## 2022-07-13 NOTE — Telephone Encounter (Signed)
Called patient, advised patient that we normally do antibiotics for valve replacements, and he did not currently have that- so no need for the antibiotics.   Also, patient states he is on Brilinta/Aspirin- they normally do not recommend to hold or interrupt any medication for one tooth, but his dentist office could contact us before proceeded to clarify.   Thanks!

## 2022-07-13 NOTE — Telephone Encounter (Signed)
Order replaced for [place].   Scheduling team, can we get this scheduled? Thank you!

## 2022-07-13 NOTE — Telephone Encounter (Signed)
Patient states that he has a dentist appt coming up and may have to have a filling. He wants to know if he needs to have an antibiotic prior to the appt. Please advise.

## 2022-07-13 NOTE — Telephone Encounter (Signed)
Patient needs another order for an echocardiogram placed, he wants to do this in [place].

## 2022-07-15 ENCOUNTER — Ambulatory Visit: Payer: Managed Care, Other (non HMO) | Attending: Cardiovascular Disease

## 2022-07-15 DIAGNOSIS — I2111 ST elevation (STEMI) myocardial infarction involving right coronary artery: Secondary | ICD-10-CM

## 2022-07-15 LAB — ECHOCARDIOGRAM COMPLETE
AR max vel: 3.21 cm2
AV Area VTI: 2.85 cm2
AV Area mean vel: 2.92 cm2
AV Mean grad: 5 mmHg
AV Peak grad: 8.6 mmHg
Ao pk vel: 1.47 m/s
Area-P 1/2: 3.07 cm2
Calc EF: 51.2 %
S' Lateral: 3.8 cm
Single Plane A2C EF: 51.6 %
Single Plane A4C EF: 52 %

## 2022-08-04 ENCOUNTER — Other Ambulatory Visit: Payer: Self-pay | Admitting: Cardiovascular Disease

## 2022-09-03 ENCOUNTER — Encounter: Payer: Self-pay | Admitting: Cardiovascular Disease

## 2022-09-03 ENCOUNTER — Ambulatory Visit: Payer: Managed Care, Other (non HMO) | Attending: Cardiovascular Disease | Admitting: Cardiovascular Disease

## 2022-09-03 VITALS — BP 112/64 | HR 61 | Ht 69.0 in | Wt 201.2 lb

## 2022-09-03 DIAGNOSIS — E782 Mixed hyperlipidemia: Secondary | ICD-10-CM | POA: Diagnosis not present

## 2022-09-03 DIAGNOSIS — K219 Gastro-esophageal reflux disease without esophagitis: Secondary | ICD-10-CM

## 2022-09-03 DIAGNOSIS — Z79899 Other long term (current) drug therapy: Secondary | ICD-10-CM | POA: Diagnosis not present

## 2022-09-03 DIAGNOSIS — R0789 Other chest pain: Secondary | ICD-10-CM | POA: Diagnosis not present

## 2022-09-03 DIAGNOSIS — I2111 ST elevation (STEMI) myocardial infarction involving right coronary artery: Secondary | ICD-10-CM | POA: Diagnosis not present

## 2022-09-03 DIAGNOSIS — Z9861 Coronary angioplasty status: Secondary | ICD-10-CM

## 2022-09-03 MED ORDER — METOPROLOL TARTRATE 25 MG PO TABS: ORAL_TABLET | ORAL | 3 refills | Status: DC

## 2022-09-03 NOTE — Progress Notes (Signed)
Cardiology Office Note    Date:  09/09/2022   ID:  Eric [facility], DOB [birth year], MRN [id number]  PCP:  [place]land Pink, MD  Cardiologist:  Shelva Majestic, MD   10 month F/U office visit  History of Present Illness:  Eric Anderson is a 62 y.o. male who presented to [facility] July 12, 2021 with transfer from [facility] where he presented with inferolateral ST segment elevation myocardial infarction.  At that time the Cath Lab at [facility] was occupied and he was transferred to [facility].  Upon arrival he was taken emergently to the cardiac catheterization laboratory where I performed cardiac catheterization and successful PCI to a large dominant totally occluded proximal RCA with insertion of a 3.5 x 26 mm Medtronic Onyx frontiers stent.  There was significant thrombus burden for which Aggrastat was started.  With initial reperfusion he became bradycardic with junctional rhythm treated with atropine and due to hypotension transiently required Levophed in addition to IV fluid bolus.  He also at the time underwent PTCA of a totally occluded large PLA branch with restoration of TIMI-3 flow.  At the time of the catheterization he also had high-grade complex proximal and mid trifurcation LAD stenoses in addition to 70% circumflex stenosis.  Two days later on July 14, 2021 he underwent successful staged PCI with scoring balloon and insertion of a 2.5 x 34 mm Medtronic Onyx frontiers stent to cover both LAD lesions.  There was an 80% ostial diagonal stenosis which was not intervened upon.  His circumflex lesion was treated medically.  An echo Doppler study on July 12, 2021 showed EF at 50 to 55% with moderately reduced RV function consistent with a component of RV infarction secondary to his large RCA occlusion.  Subsequently, he did well and was discharged on July 15, 2021 on aspirin/Brilinta for DAPT, atorvastatin 80 mg, metoprolol tartrate 12.5 mg twice a day, in addition to  his prior Effexor XR and finasteride.  He was evaluated by Coletta Memos, NP on July 24, 2021 for his post MI initial evaluation and denied any recurrent chest pain symptomatology.  I saw him for my initial office evaluation on Oct 29, 2021.  He felt well and denied pany chest pain or shortness of breath.  He works at [employer].  Laboratory on Oct 21, 2021 showed total cholesterol 172, triglycerides 230, HDL 39, and LDL 94 despite taking atorvastatin 80 mg and fenofibrate 120 mg.  He continues to be on DAPT with aspirin/Brilinta.  He continues to be on metoprolol 12.5 mg twice a day.  During that evaluation I added Vascepa 2 g twice a day to take along with atorvastatin 80 mg, fenofibrate 120 mg.  I also added Zetia 10 mg daily.  I recommended follow-up laboratory with LP(a).  Presently, he feels well and denies any chest pain.  An echo Doppler study on July 15, 2022 showed EF 55 to 60% with normal diastolic parameters.  There were no wall motion abnormalities.  He never followed up with his laboratory as recommended.  He continues to be on aspirin/Brilinta for DAPT.  He is on atorvastatin 80 mg, Zetia 10 mg, and Vascepa 2 capsules twice a day for mixed hyperlipidemia.  He is on metoprolol to tartrate 12.5 mg twice a day for blood pressure and his CAD.  He takes omeprazole for GERD.  He also is on Effexor XR 150 mg daily.  He presents for evaluation.  Past Medical History:  Diagnosis Date   History of BPH    Hyperlipidemia    Urine retention     Past Surgical History:  Procedure Laterality Date   ANTERIOR FUSION CERVICAL SPINE     COLONOSCOPY WITH PROPOFOL N/A 05/15/2020   Procedure: COLONOSCOPY WITH PROPOFOL;  Surgeon: Toledo, Benay Pike, MD;  Location: [facility] ENDOSCOPY;  Service: Gastroenterology;  Laterality: N/A;   CORONARY STENT INTERVENTION N/A 07/14/2021   Procedure: CORONARY STENT INTERVENTION;  Surgeon: Troy Sine, MD;  Location: [facility];  Service: Cardiovascular;   Laterality: N/A;   CORONARY/GRAFT ACUTE MI REVASCULARIZATION N/A 07/12/2021   Procedure: Coronary/Graft Acute MI Revascularization;  Surgeon: Troy Sine, MD;  Location: [facility];  Service: Cardiovascular;  Laterality: N/A;   LAPAROSCOPIC APPENDECTOMY N/A 02/20/2015   Procedure: APPENDECTOMY LAPAROSCOPIC;  Surgeon: Dia Crawford III, MD;  Location: [facility] ORS;  Service: General;  Laterality: N/A;   LEFT HEART CATH N/A 07/14/2021   Procedure: Left Heart Cath;  Surgeon: Troy Sine, MD;  Location: [facility];  Service: Cardiovascular;  Laterality: N/A;   LEFT HEART CATH AND CORONARY ANGIOGRAPHY N/A 07/12/2021   Procedure: LEFT HEART CATH AND CORONARY ANGIOGRAPHY;  Surgeon: Troy Sine, MD;  Location: [facility];  Service: Cardiovascular;  Laterality: N/A;    Current Medications: Outpatient Medications Prior to Visit  Medication Sig Dispense Refill   atorvastatin (LIPITOR) 80 MG tablet TAKE 1 TABLET BY MOUTH DAILY 90 tablet 1   B Complex-C (SUPER B COMPLEX PO) Take 1 tablet by mouth daily.     ezetimibe (ZETIA) 10 MG tablet TAKE 1 TABLET BY MOUTH DAILY 90 tablet 1   finasteride (PROSCAR) 5 MG tablet Take 5 mg by mouth daily.     Flaxseed, Linseed, (FLAXSEED OIL PO) Take 1 capsule by mouth 3 (three) times daily.     ibuprofen (ADVIL,MOTRIN) 200 MG tablet Take 400 mg by mouth every 6 (six) hours as needed for mild pain.     icosapent Ethyl (VASCEPA) 1 g capsule TAKE 2 CAPSULES BY MOUTH TWICE  DAILY 360 capsule 1   nitroGLYCERIN (NITROSTAT) 0.4 MG SL tablet Place 1 tablet (0.4 mg total) under the tongue every 5 (five) minutes as needed for chest pain. For up to 3 doses 25 tablet 4   omeprazole (PRILOSEC) 20 MG capsule Take 20 mg by mouth daily.     sildenafil (VIAGRA) 100 MG tablet Take by mouth.     ticagrelor (BRILINTA) 90 MG TABS tablet Take 1 tablet (90 mg total) by mouth 2 (two) times daily. 60 tablet 0   venlafaxine XR (EFFEXOR-XR) 150 MG 24 hr capsule Take 150 mg by  mouth daily with breakfast.     aspirin 81 MG chewable tablet Chew 1 tablet (81 mg total) by mouth daily. 90 tablet 3   metoprolol tartrate (LOPRESSOR) 25 MG tablet Take 1/2 tablets (12.5 mg total) by mouth 2 (two) times daily. 90 tablet 3   No facility-administered medications prior to visit.     Allergies:   Patient has no known allergies.   Social History   Socioeconomic History   Marital status: Married    Spouse name: Not on file   Number of children: Not on file   Years of education: Not on file   Highest education level: Not on file  Occupational History   Not on file  Tobacco Use   Smoking status: Never   Smokeless tobacco: Never  Vaping Use  Vaping Use: Never used  Substance and Sexual Activity   Alcohol use: Yes    Alcohol/week: 0.0 standard drinks of alcohol    Comment: occasional   Drug use: No   Sexual activity: Not on file  Other Topics Concern   Not on file  Social History Narrative   Not on file   Social Determinants of Health   Financial Resource Strain: Not on file  Food Insecurity: Not on file  Transportation Needs: Not on file  Physical Activity: Not on file  Stress: Not on file  Social Connections: Not on file     Family History:  The patient's family history includes Diabetes in his brother and sister; Heart disease in his father.   ROS General: Negative; No fevers, chills, or night sweats;  HEENT: Negative; No changes in vision or hearing, sinus congestion, difficulty swallowing Pulmonary: Negative; No cough, wheezing, shortness of breath, hemoptysis Cardiovascular: Negative; No chest pain, presyncope, syncope, palpitations GI: Negative; No nausea, vomiting, diarrhea, or abdominal pain GU: Negative; No dysuria, hematuria, or difficulty voiding Musculoskeletal: Negative; no myalgias, joint pain, or weakness Hematologic/Oncology: Negative; no easy bruising, bleeding Endocrine: Negative; no heat/cold intolerance; no diabetes Neuro:  Negative; no changes in balance, headaches Skin: Negative; No rashes or skin lesions Psychiatric: On Effexor Sleep: Negative; No snoring, daytime sleepiness, hypersomnolence, bruxism, restless legs, hypnogognic hallucinations, no cataplexy Other comprehensive 14 point system review is negative.   PHYSICAL EXAM:   VS:  BP 112/64 (BP Location: Left Arm, Patient Position: Sitting, Cuff Size: Normal)   Pulse 61   Ht 5\' 9"  (1.753 m)   Wt 201 lb 3.2 oz (91.3 kg)   SpO2 96%   BMI 29.71 kg/m     Repeat blood pressure by me 122/74  Wt Readings from Last 3 Encounters:  09/03/22 201 lb 3.2 oz (91.3 kg)  10/29/21 204 lb 12.8 oz (92.9 kg)  07/24/21 203 lb (92.1 kg)    General: Alert, oriented, no distress.  Skin: normal turgor, no rashes, warm and dry HEENT: Normocephalic, atraumatic. Pupils equal round and reactive to light; sclera anicteric; extraocular muscles intact; Nose without nasal septal hypertrophy Mouth/Parynx benign; Mallinpatti scale 3 Neck: No JVD, no carotid bruits; normal carotid upstroke Lungs: clear to ausculatation and percussion; no wheezing or rales Chest wall: without tenderness to palpitation Heart: PMI not displaced, RRR, s1 s2 normal, 1/6 systolic murmur, no diastolic murmur, no rubs, gallops, thrills, or heaves Abdomen: soft, nontender; no hepatosplenomehaly, BS+; abdominal aorta nontender and not dilated by palpation. Back: no CVA tenderness Pulses 2+ Musculoskeletal: full range of motion, normal strength, no joint deformities Extremities: no clubbing cyanosis or edema, Homan's sign negative  Neurologic: grossly nonfocal; Cranial nerves grossly wnl Psychologic: Normal mood and affect    Studies/Labs Reviewed:   September 03, 2022 ECG (independently read by me): NSR at 62, Inferior Q waves c/w old inferior MI  Oct 29, 2021 ECG (independently read by me): NSR at 63, Inferior MI, no ectopy, normal intervals  Recent Labs:    Latest Ref Rng & Units 07/15/2021     1:03 AM 07/14/2021    2:39 AM 07/13/2021   12:30 AM  BMP  Glucose 70 - 99 mg/dL 101  122  133   BUN 6 - 20 mg/dL 12  10  10    Creatinine 0.61 - 1.24 mg/dL 0.96  0.81  0.83   Sodium 135 - 145 mmol/L 139  136  136   Potassium 3.5 - 5.1 mmol/L 4.5  3.9  3.9   Chloride 98 - 111 mmol/L 109  107  106   CO2 22 - 32 mmol/L 18  21  21    Calcium 8.9 - 10.3 mg/dL 8.6  8.5  8.2         Latest Ref Rng & Units 07/12/2021    1:26 PM 02/24/2015   10:02 PM 02/21/2015    7:11 AM  Hepatic Function  Total Protein 6.5 - 8.1 g/dL 6.6  7.4  6.6   Albumin 3.5 - 5.0 g/dL 3.9  3.8  3.4   AST 15 - 41 U/L 32  23  22   ALT 0 - 44 U/L 34  25  16   Alk Phosphatase 38 - 126 U/L 68  62  59   Total Bilirubin 0.3 - 1.2 mg/dL 0.8  0.9  1.2        Latest Ref Rng & Units 10/21/2021   10:07 AM 07/15/2021    1:03 AM 07/14/2021    2:39 AM  CBC  WBC 3.4 - 10.8 x10E3/uL 7.0  13.5  14.0   Hemoglobin 13.0 - 17.7 g/dL 15.3  11.7  12.4   Hematocrit 37.5 - 51.0 % 46.1  35.5  37.1   Platelets 150 - 450 x10E3/uL 358  292  282    Lab Results  Component Value Date   MCV 84 10/21/2021   MCV 84.3 07/15/2021   MCV 83.6 07/14/2021   No results found for: "TSH" Lab Results  Component Value Date   HGBA1C 5.6 07/12/2021     BNP No results found for: "BNP"  ProBNP No results found for: "PROBNP"   Lipid Panel     Component Value Date/Time   CHOL 172 10/21/2021 1007   TRIG 230 (H) 10/21/2021 1007   HDL 39 (L) 10/21/2021 1007   CHOLHDL 4.4 10/21/2021 1007   CHOLHDL 5.9 07/12/2021 1326   VLDL 18 07/12/2021 1326   LDLCALC 94 10/21/2021 1007   LABVLDL 39 10/21/2021 1007     RADIOLOGY: No results found.   Additional studies/ records that were reviewed today include:   CATH/PCI: 07/12/2021   Prox RCA lesion is 100% stenosed.   3rd RPL lesion is 100% stenosed.   2nd RPL lesion is 70% stenosed.   2nd Diag lesion is 75% stenosed.   Mid Cx to Dist Cx lesion is 70% stenosed.   Mid LAD-1 lesion is 80% stenosed with 80%  stenosed side branch in 2nd Sept.   Mid LAD-2 lesion is 50% stenosed.   RPDA lesion is 40% stenosed.   A drug-eluting stent was successfully placed.   Post intervention, there is a 0% residual stenosis.   Post intervention, there is a 0% residual stenosis.   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   Multivessel CAD with mild coronary calcification of the LAD at the ostium with 50% proximal stenosis, complex 80 and 75% stenosis of the proximal to mid LAD and ostial diagonal vessel with 50% mid LAD stenosis; 50% proximal stenosis in the ramus intermediate vessel; 70% proximal circumflex stenosis with total occlusion disease (appears chronic) of the mid vessel after small marginal branches with very faint filling of diminutive small branches distally.   LVEDP 12 mmHg.   Successful percutaneous coronary intervention of the large dominant RCA with PTCA and stenting of the diffusely diseased proximal RCA with insertion of a 3.5 x 26 mm Medtronic Onyx frontiers stent postdilated to  3.78 mm with the proximal stenosis being reduced to 0%.  There was significant thrombus burden for which Aggrastat was started.  In addition, with initial reperfusion, the patient became bradycardic with junctional rhythm treated with atropine 0.5 mg x 2 and due to hypotension with blood pressure dropping into the 60s received IV fluid bolus and Levophed inotropic therapy.    Successful PTCA of the totally occluded PLA branch with restoration of TIMI-3 flow.   RECOMMENDATION: DAPT for minimum of 1 year but most likely longer.  Initiate medical therapy for concomitant CAD.  Will ask colleagues to review concerning potential need for LAD intervention following stability.  Suspect the circumflex occlusion is chronic and will treat medically.  Aggressive lipid-lowering therapy with target LDL less than 70.  A 2D echo Doppler study will be obtained in a.m. for  assessment of LV function.    Intervention     CATH/PCI: 07/14/2021   Mid LAD-1 lesion is 80% stenosed with 80% stenosed side branch in 2nd Sept.   Mid LAD-2 lesion is 70% stenosed.   Mid Cx to Dist Cx lesion is 70% stenosed.   2nd Diag lesion is 80% stenosed.   A drug-eluting stent was successfully placed.   Post intervention, there is a 0% residual stenosis.   Post intervention, there is a 0% residual stenosis.   Post intervention, the side branch was reduced to 0% residual stenosis.   Successful PCI involving complex stenosis in the proximal LAD at the takeoff of the septal and diagonal vessel with 80% ostial diagonal stenosis with a sharp angled segment and 70% mid stenosis of the LAD.  A score flex balloon was initially used at the complex LAD stenosis involving the trifurcation of septal and diagonal vessel to reduce potential for plaque shifting.  Following insertion of a 2.5 x 34 mm Medtronic Onyx Frontier stent postdilated to 2.84 proximally to 2.72 distally, the stenosis was reduced to 0%.  At the completion of the procedure there was TIMI-3 flow down the LAD as well as the diagonal vessel but there continued to be 80-85% ostial diagonal stenosis prior to the sharp bend in the vessel.    RECOMMENDATION: In this patient who is status post acute PCI to a totally occluded RCA treated successfully on July 11, 2021 recommend DAPT for minimum of 12 months but probably indefinitely.  Increase medical therapy for concomitant CAD with  chronic occlusion of the circumflex and residual diagonal disease.    Intervention     ECHO: 07/13/2021  1. Mid / basal inferior hypokinesis . Left ventricular ejection fraction,  by estimation, is 50 to 55%. The left ventricle has low normal function.  The left ventricle demonstrates regional wall motion abnormalities (see  scoring diagram/findings for  description). There is moderate left ventricular hypertrophy. Left  ventricular diastolic  parameters were normal.   2. ? RV infarction . Right ventricular systolic function is moderately  reduced. The right ventricular size is moderately enlarged.   3. Right atrial size was mildly dilated.   4. The mitral valve is abnormal. Trivial mitral valve regurgitation. No  evidence of mitral stenosis.   5. The aortic valve is tricuspid. There is mild calcification of the  aortic valve. Aortic valve regurgitation is not visualized. Aortic valve  sclerosis is present, with no evidence of aortic valve stenosis.   6. The inferior vena cava is dilated in size with >50% respiratory  variability, suggesting right atrial pressure of 8 mmHg.    ECHO: 07/15/2022  1. Left ventricular ejection fraction, by estimation, is 55 to 60%. The  left ventricle has normal function. The left ventricle has no regional  wall motion abnormalities. Left ventricular diastolic parameters were  normal. The average left ventricular  global longitudinal strain is -17.7 %. The global longitudinal strain is  normal.   2. Right ventricular systolic function is normal. The right ventricular  size is normal.   3. The mitral valve is normal in structure. Trivial mitral valve  regurgitation.   4. The aortic valve is grossly normal. Aortic valve regurgitation is not  visualized.   5. The inferior vena cava is normal in size with greater than 50%  respiratory variability, suggesting right atrial pressure of 3 mmHg.   ASSESSMENT:    1. ST elevation myocardial infarction involving right coronary artery ([place])   2. History of percutaneous coronary interventions: RCA and LAD   3. Mixed hyperlipidemia   4. Medication management   5. Gastroesophageal reflux disease without esophagitis     PLAN:  Eric [facility] is a 62 year old gentleman who developed an acute inferolateral large myocardial infarction in July 12, 2021.  He was transferred to [facility] doing 2 and occupied Cath Lab at [facility] and was found to have total  proximal RCA occlusion..  Also was extensive thrombus burden and total occlusion of a large distal PLA branch.  He underwent successful initial intervention with stenting of his proximal to mid RCA and PTCA of his PLA branch.  2 days later he underwent staged intervention to a complex LAD trifurcation stenosis with successful intervention with initial score flex balloon dilatation followed by insertion of 2.5 x 34 mm Medtronic Onyx frontiers stent.  At the completion of the procedure he had brisk TIMI-3 flow down the LAD and diagonal in his diagonal was unchanged with the 80% ostial stenosis prior to the sharp bend in the vessel.  He also had concomitant 70% distal circumflex stenosis.  Presently, he remains active but at times admits to very rare sensation of chest tightness.  He continues to be active and works at [employer].  He continues to be on DAPT.  Lipid studies on Oct 21, 2021  showed total cholesterol 172, triglycerides 230, HDL 39 and LDL 94.  At that time I added Vascepa as well as Zetia to his antilipidemia treatment with recommended target LDL less than 55.  I recommended he have follow-up laboratory.  Apparently this has not been done.  He works at [employer] and I recommended that he obtain a comprehensive metabolic panel, CBC, TSH, lipid panel, and LP(a).  If he is unable to reach target, PCSK9 inhibition may be necessary.  With his very rare episodes of chest tightness and concomitant CAD I am recommending slight titration of his beta-blocker therapy and will increase metoprolol to 25 mg in the morning and 12.5 mg at night.  I will notify him of the results of his laboratory and adjustments will be made to his medical regimen if necessary.  I will see him in 3 months for follow-up evaluation.   Medication Adjustments/Labs and Tests Ordered: Current medicines are reviewed at length with the patient today.  Concerns regarding medicines are outlined above.  Medication changes, Labs and Tests ordered  today are listed in the Patient Instructions below. Patient Instructions  Medication Instructions:   Change Metoprolol  tartrate 25 mg in the morning and 12.5 mg in the evening   Use sublingual nitroglycerin if needed    *If you need a  refill on your cardiac medications before your next appointment, please call your pharmacy*   Lab Work: Fasting   Lipid LPa CMP CBC TSH If you have labs (blood work) drawn today and your tests are completely normal, you will receive your results only by: [place] (if you have MyChart) OR A paper copy in the mail If you have any lab test that is abnormal or we need to change your treatment, we will call you to review the results.   Testing/Procedures:  Not needed  Follow-Up: At [facility], you and your health needs are our priority.  As part of our continuing mission to provide you with exceptional heart care, we have created designated Provider Care Teams.  These Care Teams include your primary Cardiologist (physician) and Advanced Practice Providers (APPs -  Physician Assistants and Nurse Practitioners) who all work together to provide you with the care you need, when you need it.     Your next appointment:   3 month(s)  The format for your next appointment:   In Person  Provider:   Shelva Majestic, MD    Other Instructions    Signed, Shelva Majestic, MD  09/09/2022 10:06 AM    [facility] [address], [place], [place], [place]  [address] Phone: [phone]

## 2022-09-03 NOTE — Patient Instructions (Signed)
Medication Instructions:   Change Metoprolol  tartrate 25 mg in the morning and 12.5 mg in the evening   Use sublingual nitroglycerin if needed    *If you need a refill on your cardiac medications before your next appointment, please call your pharmacy*   Lab Work: Fasting   Lipid LPa CMP CBC TSH If you have labs (blood work) drawn today and your tests are completely normal, you will receive your results only by: [place] (if you have MyChart) OR A paper copy in the mail If you have any lab test that is abnormal or we need to change your treatment, we will call you to review the results.   Testing/Procedures:  Not needed  Follow-Up: At [facility], you and your health needs are our priority.  As part of our continuing mission to provide you with exceptional heart care, we have created designated Provider Care Teams.  These Care Teams include your primary Cardiologist (physician) and Advanced Practice Providers (APPs -  Physician Assistants and Nurse Practitioners) who all work together to provide you with the care you need, when you need it.     Your next appointment:   3 month(s)  The format for your next appointment:   In Person  Provider:   Shelva Majestic, MD    Other Instructions

## 2022-09-04 ENCOUNTER — Telehealth: Payer: Self-pay | Admitting: Cardiovascular Disease

## 2022-09-04 NOTE — Telephone Encounter (Signed)
Pt notified only tylenol

## 2022-09-04 NOTE — Telephone Encounter (Signed)
Patient wants to know which type of over-the-counter medication he can take for pain.

## 2022-09-06 ENCOUNTER — Other Ambulatory Visit: Payer: Self-pay | Admitting: Cardiovascular Disease

## 2022-09-09 ENCOUNTER — Encounter: Payer: Self-pay | Admitting: Cardiovascular Disease

## 2022-09-21 ENCOUNTER — Other Ambulatory Visit: Payer: Self-pay | Admitting: Cardiovascular Disease

## 2022-10-13 ENCOUNTER — Other Ambulatory Visit (HOSPITAL_COMMUNITY): Payer: Self-pay

## 2022-10-13 ENCOUNTER — Telehealth: Payer: Self-pay

## 2022-10-13 NOTE — Telephone Encounter (Signed)
Pharmacy Patient Advocate Encounter  Prior Authorization for Icosapent Ethyl 1GM capsules has been approved by [employer] (ins).    KEY # [id number] Effective dates: 5.7.24 through 5.7.25

## 2022-10-13 NOTE — Telephone Encounter (Addendum)
Pharmacy Patient Advocate Encounter   Received notification from [employer] Prior Auth Dpt  that prior authorization for Icosapent Cap 1gm is required/requested.   A submitted on 5.7.24  to (ins) 5.7.24 via CoverMyMeds Key or (Medicaid) confirmation # [id number]    Status is pending

## 2022-11-17 ENCOUNTER — Other Ambulatory Visit (HOSPITAL_BASED_OUTPATIENT_CLINIC_OR_DEPARTMENT_OTHER): Payer: Self-pay | Admitting: General Practice

## 2022-11-17 NOTE — Progress Notes (Signed)
Cardiology Office Note    Date:  11/20/2022   ID:  SADAT YARTER, DOB [birth year], MRN [id number]  PCP:  [name], MD  Cardiologist:  [name], MD   3 month F/U office visit  History of Present Illness:  Eric Anderson is a 62 y.o. male with known CAD, status post MI, mixed hyperlipidemia, and GERD who presents for a 23-month follow-up cardiology evaluation.   Eric Anderson presented to [facility] July 12, 2021 with transfer from [facility] ER where he presented with inferolateral ST segment elevation myocardial infarction.  At that time the Cath Lab at [facility] was occupied and he was transferred to [facility]  Upon arrival he was taken emergently to the cardiac catheterization laboratory where I performed cardiac catheterization and successful PCI to a large dominant totally occluded proximal RCA with insertion of a 3.5 x 26 mm Medtronic Onyx frontiers stent.  There was significant thrombus burden for which Aggrastat was started.  With initial reperfusion he became bradycardic with junctional rhythm treated with atropine and due to hypotension transiently required Levophed in addition to IV fluid bolus.  He also at the time underwent PTCA of a totally occluded large PLA branch with restoration of TIMI-3 flow.  At the time of the catheterization he also had high-grade complex proximal and mid trifurcation LAD stenoses in addition to 70% circumflex stenosis.  Two days later on July 14, 2021 he underwent successful staged PCI with scoring balloon and insertion of a 2.5 x 34 mm Medtronic Onyx frontiers stent to cover both LAD lesions.  There was an 80% ostial diagonal stenosis which was not intervened upon.  His circumflex lesion was treated medically.  An echo Doppler study on July 12, 2021 showed EF at 50 to 55% with moderately reduced RV function consistent with a component of RV infarction secondary to his large RCA occlusion.  Subsequently, he did well and was  discharged on July 15, 2021 on aspirin/Brilinta for DAPT, atorvastatin 80 mg, metoprolol tartrate 12.5 mg twice a day, in addition to his prior Effexor XR and finasteride.  He was evaluated by Edd Fabian, NP on July 24, 2021 for his post MI initial evaluation and denied any recurrent chest pain symptomatology.  I saw him for my initial office evaluation on Oct 29, 2021.  He felt well and denied pany chest pain or shortness of breath.  He works at [employer].  Laboratory on Oct 21, 2021 showed total cholesterol 172, triglycerides 230, HDL 39, and LDL 94 despite taking atorvastatin 80 mg and fenofibrate 120 mg.  He continues to be on DAPT with aspirin/Brilinta.  He continues to be on metoprolol 12.5 mg twice a day.  During that evaluation I added Vascepa 2 g twice a day to take along with atorvastatin 80 mg, fenofibrate 120 mg.  I also added Zetia 10 mg daily.  I recommended follow-up laboratory with LP(a).  I last saw him on March 28 at which time he felt well and denied any recurrent chest pain.  An echo Doppler study on July 15, 2022 showed EF 55 to 60% with normal diastolic parameters.  There were no wall motion abnormalities.  He never followed up with his laboratory as recommended.  He continues to be on aspirin/Brilinta for DAPT.  He is on atorvastatin 80 mg, Zetia 10 mg, and Vascepa 2 capsules twice a day for mixed hyperlipidemia.  He is on metoprolol to tartrate 12.5 mg twice a day for  blood pressure and his CAD.  He takes omeprazole for GERD.  He also is on Effexor XR 150 mg daily.  With his concomitant CAD I recommended slight titration of metoprolol and discussed possible addition of PCSK9 inhibition if he was unable to achieve a target LDL less than 55.  I last saw him, he continues to work at [employer].  He denies any recurrent chest pain symptomatology.  He continues to be on DAPT with aspirin/Brilinta.  He is on atorvastatin 80 mg, Zetia 10 mg, and Vascepa 2 capsules twice a day for  mixed hyperlipidemia.  May 2023 LDL cholesterol was elevated at 94 with triglycerides 230.  Most recent laboratory from November 17, 2022 now shows significant benefit with total cholesterol 125, triglycerides 103, HDL 39, and LDL now at 67.  I also checked LP(a) which is significantly elevated at 213.3.  He continues to be on Effexor 150 mg daily and omeprazole 20 mg daily for GERD.  He presents for follow-up evaluation.   Past Medical History:  Diagnosis Date   History of BPH    Hyperlipidemia    Urine retention     Past Surgical History:  Procedure Laterality Date   ANTERIOR FUSION CERVICAL SPINE     COLONOSCOPY WITH PROPOFOL N/A 05/15/2020   Procedure: COLONOSCOPY WITH PROPOFOL;  Surgeon: Toledo, Boykin Nearing, MD;  Location: [facility] ENDOSCOPY;  Service: Gastroenterology;  Laterality: N/A;   CORONARY STENT INTERVENTION N/A 07/14/2021   Procedure: CORONARY STENT INTERVENTION;  Surgeon: Lennette Bihari, MD;  Location: [facility] INVASIVE CV LAB;  Service: Cardiovascular;  Laterality: N/A;   CORONARY/GRAFT ACUTE MI REVASCULARIZATION N/A 07/12/2021   Procedure: Coronary/Graft Acute MI Revascularization;  Surgeon: Lennette Bihari, MD;  Location: [facility] INVASIVE CV LAB;  Service: Cardiovascular;  Laterality: N/A;   LAPAROSCOPIC APPENDECTOMY N/A 02/20/2015   Procedure: APPENDECTOMY LAPAROSCOPIC;  Surgeon: Tiney Rouge III, MD;  Location: [facility] ORS;  Service: General;  Laterality: N/A;   LEFT HEART CATH N/A 07/14/2021   Procedure: Left Heart Cath;  Surgeon: Lennette Bihari, MD;  Location: [facility] INVASIVE CV LAB;  Service: Cardiovascular;  Laterality: N/A;   LEFT HEART CATH AND CORONARY ANGIOGRAPHY N/A 07/12/2021   Procedure: LEFT HEART CATH AND CORONARY ANGIOGRAPHY;  Surgeon: Lennette Bihari, MD;  Location: [facility] INVASIVE CV LAB;  Service: Cardiovascular;  Laterality: N/A;    Current Medications: Outpatient Medications Prior to Visit  Medication Sig Dispense Refill   ASPIRIN LOW DOSE 81 MG chewable tablet CHEW 1 TABLET BY MOUTH  DAILY. 90 tablet 3   atorvastatin (LIPITOR) 80 MG tablet TAKE 1 TABLET BY MOUTH DAILY 90 tablet 1   B Complex-C (SUPER B COMPLEX PO) Take 1 tablet by mouth daily.     BRILINTA 90 MG TABS tablet TAKE 1 TABLET BY MOUTH TWICE  DAILY 60 tablet 11   ezetimibe (ZETIA) 10 MG tablet TAKE 1 TABLET BY MOUTH DAILY 90 tablet 1   finasteride (PROSCAR) 5 MG tablet Take 5 mg by mouth daily.     Flaxseed, Linseed, (FLAXSEED OIL PO) Take 1 capsule by mouth 3 (three) times daily.     ibuprofen (ADVIL,MOTRIN) 200 MG tablet Take 400 mg by mouth every 6 (six) hours as needed for mild pain.     icosapent Ethyl (VASCEPA) 1 g capsule TAKE 2 CAPSULES BY MOUTH TWICE  DAILY 360 capsule 1   omeprazole (PRILOSEC) 20 MG capsule Take 20 mg by mouth daily.     sildenafil (VIAGRA) 100 MG tablet Take by mouth.  venlafaxine XR (EFFEXOR-XR) 150 MG 24 hr capsule Take 150 mg by mouth daily with breakfast.     metoprolol tartrate (LOPRESSOR) 25 MG tablet Take 25 mg ( whole tablet) in the morning and 12.5 mg ( 1/2 tablet) in the evening 135 tablet 3   nitroGLYCERIN (NITROSTAT) 0.4 MG SL tablet Place 1 tablet (0.4 mg total) under the tongue every 5 (five) minutes as needed for chest pain. For up to 3 doses 25 tablet 4   No facility-administered medications prior to visit.     Allergies:   Patient has no known allergies.   Social History   Socioeconomic History   Marital status: Married    Spouse name: Not on file   Number of children: Not on file   Years of education: Not on file   Highest education level: Not on file  Occupational History   Not on file  Tobacco Use   Smoking status: Never   Smokeless tobacco: Never  Vaping Use   Vaping Use: Never used  Substance and Sexual Activity   Alcohol use: Yes    Alcohol/week: 0.0 standard drinks of alcohol    Comment: occasional   Drug use: No   Sexual activity: Not on file  Other Topics Concern   Not on file  Social History Narrative   Not on file   Social  Determinants of Health   Financial Resource Strain: Not on file  Food Insecurity: Not on file  Transportation Needs: Not on file  Physical Activity: Not on file  Stress: Not on file  Social Connections: Not on file     Family History:  The patient's family history includes Diabetes in his brother and sister; Heart disease in his father.   ROS General: Negative; No fevers, chills, or night sweats;  HEENT: Negative; No changes in vision or hearing, sinus congestion, difficulty swallowing Pulmonary: Negative; No cough, wheezing, shortness of breath, hemoptysis Cardiovascular: Negative; No chest pain, presyncope, syncope, palpitations GI: Negative; No nausea, vomiting, diarrhea, or abdominal pain GU: Negative; No dysuria, hematuria, or difficulty voiding Musculoskeletal: Negative; no myalgias, joint pain, or weakness Hematologic/Oncology: Negative; no easy bruising, bleeding Endocrine: Negative; no heat/cold intolerance; no diabetes Neuro: Negative; no changes in balance, headaches Skin: Negative; No rashes or skin lesions Psychiatric: On Effexor Sleep: Negative; No snoring, daytime sleepiness, hypersomnolence, bruxism, restless legs, hypnogognic hallucinations, no cataplexy Other comprehensive 14 point system review is negative.   PHYSICAL EXAM:   VS:  BP 128/68   Pulse 66   Ht 5\' 8"  (1.727 m)   Wt 207 lb 9.6 oz (94.2 kg)   BMI 31.57 kg/m     Repeat blood pressure by me 122/74  Wt Readings from Last 3 Encounters:  11/20/22 207 lb 9.6 oz (94.2 kg)  09/03/22 201 lb 3.2 oz (91.3 kg)  10/29/21 204 lb 12.8 oz (92.9 kg)      Physical Exam BP 128/68   Pulse 66   Ht 5\' 8"  (1.727 m)   Wt 207 lb 9.6 oz (94.2 kg)   BMI 31.57 kg/m  General: Alert, oriented, no distress.  Skin: normal turgor, no rashes, warm and dry HEENT: Normocephalic, atraumatic. Pupils equal round and reactive to light; sclera anicteric; extraocular muscles intact;  Nose without nasal septal  hypertrophy Mouth/Parynx benign; Mallinpatti scale 3 Neck: No JVD, no carotid bruits; normal carotid upstroke Lungs: clear to ausculatation and percussion; no wheezing or rales Chest wall: without tenderness to palpitation Heart: PMI not displaced, RRR, s1 s2 normal,  1/6 systolic murmur, no diastolic murmur, no rubs, gallops, thrills, or heaves Abdomen: soft, nontender; no hepatosplenomehaly, BS+; abdominal aorta nontender and not dilated by palpation. Back: no CVA tenderness Pulses 2+ Musculoskeletal: full range of motion, normal strength, no joint deformities Extremities: no clubbing cyanosis or edema, Homan's sign negative  Neurologic: grossly nonfocal; Cranial nerves grossly wnl Psychologic: Normal mood and affect     Studies/Labs Reviewed:   November 20, 2022 ECG (independently read by me): NSR at 17, old IMI with inferior Q waves  September 03, 2022 ECG (independently read by me): NSR at 62, Inferior Q waves c/w old inferior MI  Oct 29, 2021 ECG (independently read by me): NSR at 63, Inferior MI, no ectopy, normal intervals  Recent Labs:    Latest Ref Rng & Units 11/17/2022    9:08 AM 07/15/2021    1:03 AM 07/14/2021    2:39 AM  BMP  Glucose 70 - 99 mg/dL 440  102  725   BUN 8 - 27 mg/dL 16  12  10    Creatinine 0.76 - 1.27 mg/dL 3.66  4.40  3.47   BUN/Creat Ratio 10 - 24 20     Sodium 134 - 144 mmol/L 141  139  136   Potassium 3.5 - 5.2 mmol/L 4.4  4.5  3.9   Chloride 96 - 106 mmol/L 104  109  107   CO2 20 - 29 mmol/L 18  18  21    Calcium 8.6 - 10.2 mg/dL 9.1  8.6  8.5         Latest Ref Rng & Units 11/17/2022    9:08 AM 07/12/2021    1:26 PM 02/24/2015   10:02 PM  Hepatic Function  Total Protein 6.0 - 8.5 g/dL 6.5  6.6  7.4   Albumin 3.9 - 4.9 g/dL 4.3  3.9  3.8   AST 0 - 40 IU/L 33  32  23   ALT 0 - 44 IU/L 43  34  25   Alk Phosphatase 44 - 121 IU/L 91  68  62   Total Bilirubin 0.0 - 1.2 mg/dL 0.5  0.8  0.9   Bilirubin, Direct 0.00 - 0.40 mg/dL 4.25          Latest  Ref Rng & Units 11/17/2022    9:08 AM 10/21/2021   10:07 AM 07/15/2021    1:03 AM  CBC  WBC 3.4 - 10.8 x10E3/uL 6.7  7.0  13.5   Hemoglobin 13.0 - 17.7 g/dL 95.6  38.7  56.4   Hematocrit 37.5 - 51.0 % 40.7  46.1  35.5   Platelets 150 - 450 x10E3/uL 345  358  292    Lab Results  Component Value Date   MCV 83 11/17/2022   MCV 84 10/21/2021   MCV 84.3 07/15/2021   Lab Results  Component Value Date   TSH 2.370 11/17/2022   Lab Results  Component Value Date   HGBA1C 5.6 07/12/2021     BNP No results found for: "BNP"  ProBNP No results found for: "PROBNP"   Lipid Panel     Component Value Date/Time   CHOL 125 11/17/2022 0908   TRIG 103 11/17/2022 0908   HDL 39 (L) 11/17/2022 0908   CHOLHDL 3.2 11/17/2022 0908   CHOLHDL 5.9 07/12/2021 1326   VLDL 18 07/12/2021 1326   LDLCALC 67 11/17/2022 0908   LABVLDL 19 11/17/2022 0908     RADIOLOGY: No results found.   Additional studies/ records that  were reviewed today include:   CATH/PCI: 07/12/2021   Prox RCA lesion is 100% stenosed.   3rd RPL lesion is 100% stenosed.   2nd RPL lesion is 70% stenosed.   2nd Diag lesion is 75% stenosed.   Mid Cx to Dist Cx lesion is 70% stenosed.   Mid LAD-1 lesion is 80% stenosed with 80% stenosed side branch in 2nd Sept.   Mid LAD-2 lesion is 50% stenosed.   RPDA lesion is 40% stenosed.   A drug-eluting stent was successfully placed.   Post intervention, there is a 0% residual stenosis.   Post intervention, there is a 0% residual stenosis.   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   Multivessel CAD with mild coronary calcification of the LAD at the ostium with 50% proximal stenosis, complex 80 and 75% stenosis of the proximal to mid LAD and ostial diagonal vessel with 50% mid LAD stenosis; 50% proximal stenosis in the ramus intermediate vessel; 70% proximal circumflex stenosis with total occlusion disease (appears  chronic) of the mid vessel after small marginal branches with very faint filling of diminutive small branches distally.   LVEDP 12 mmHg.   Successful percutaneous coronary intervention of the large dominant RCA with PTCA and stenting of the diffusely diseased proximal RCA with insertion of a 3.5 x 26 mm Medtronic Onyx frontiers stent postdilated to 3.78 mm with the proximal stenosis being reduced to 0%.  There was significant thrombus burden for which Aggrastat was started.  In addition, with initial reperfusion, the patient became bradycardic with junctional rhythm treated with atropine 0.5 mg x 2 and due to hypotension with blood pressure dropping into the 60s received IV fluid bolus and Levophed inotropic therapy.    Successful PTCA of the totally occluded PLA branch with restoration of TIMI-3 flow.   RECOMMENDATION: DAPT for minimum of 1 year but most likely longer.  Initiate medical therapy for concomitant CAD.  Will ask colleagues to review concerning potential need for LAD intervention following stability.  Suspect the circumflex occlusion is chronic and will treat medically.  Aggressive lipid-lowering therapy with target LDL less than 70.  A 2D echo Doppler study will be obtained in a.m. for assessment of LV function.    Intervention     CATH/PCI: 07/14/2021   Mid LAD-1 lesion is 80% stenosed with 80% stenosed side branch in 2nd Sept.   Mid LAD-2 lesion is 70% stenosed.   Mid Cx to Dist Cx lesion is 70% stenosed.   2nd Diag lesion is 80% stenosed.   A drug-eluting stent was successfully placed.   Post intervention, there is a 0% residual stenosis.   Post intervention, there is a 0% residual stenosis.   Post intervention, the side branch was reduced to 0% residual stenosis.   Successful PCI involving complex stenosis in the proximal LAD at the takeoff of the septal and diagonal vessel with 80% ostial diagonal stenosis with a sharp angled segment and 70% mid stenosis of the LAD.  A  score flex balloon was initially used at the complex LAD stenosis involving the trifurcation of septal and diagonal vessel to reduce potential for plaque shifting.  Following insertion of a 2.5 x 34 mm Medtronic Onyx Frontier stent postdilated to 2.84 proximally to 2.72 distally, the stenosis was reduced to 0%.  At the completion of the procedure there was TIMI-3 flow down the LAD as well as the diagonal vessel but there continued to be 80-85%  ostial diagonal stenosis prior to the sharp bend in the vessel.    RECOMMENDATION: In this patient who is status post acute PCI to a totally occluded RCA treated successfully on July 11, 2021 recommend DAPT for minimum of 12 months but probably indefinitely.  Increase medical therapy for concomitant CAD with  chronic occlusion of the circumflex and residual diagonal disease.    Intervention     ECHO: 07/13/2021  1. Mid / basal inferior hypokinesis . Left ventricular ejection fraction,  by estimation, is 50 to 55%. The left ventricle has low normal function.  The left ventricle demonstrates regional wall motion abnormalities (see  scoring diagram/findings for  description). There is moderate left ventricular hypertrophy. Left  ventricular diastolic parameters were normal.   2. ? RV infarction . Right ventricular systolic function is moderately  reduced. The right ventricular size is moderately enlarged.   3. Right atrial size was mildly dilated.   4. The mitral valve is abnormal. Trivial mitral valve regurgitation. No  evidence of mitral stenosis.   5. The aortic valve is tricuspid. There is mild calcification of the  aortic valve. Aortic valve regurgitation is not visualized. Aortic valve  sclerosis is present, with no evidence of aortic valve stenosis.   6. The inferior vena cava is dilated in size with >50% respiratory  variability, suggesting right atrial pressure of 8 mmHg.    ECHO: 07/15/2022  1. Left ventricular ejection fraction, by  estimation, is 55 to 60%. The  left ventricle has normal function. The left ventricle has no regional  wall motion abnormalities. Left ventricular diastolic parameters were  normal. The average left ventricular  global longitudinal strain is -17.7 %. The global longitudinal strain is  normal.   2. Right ventricular systolic function is normal. The right ventricular  size is normal.   3. The mitral valve is normal in structure. Trivial mitral valve  regurgitation.   4. The aortic valve is grossly normal. Aortic valve regurgitation is not  visualized.   5. The inferior vena cava is normal in size with greater than 50%  respiratory variability, suggesting right atrial pressure of 3 mmHg.   ASSESSMENT:    1. STEMI involving right coronary artery (HCC)   2. History of percutaneous coronary intervention: RCA, LAD   3. Mixed hyperlipidemia   4. 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 Shortness of breath   6. Gastroesophageal reflux disease without esophagitis     PLAN:  Eric Anderson is a 62 year old gentleman who developed an acute inferolateral large myocardial infarction in July 12, 2021.  He was transferred to [facility] doing 2 and occupied Cath Lab at [facility] and was found to have total proximal RCA occlusion..  Also was extensive thrombus burden and total occlusion of a large distal PLA branch.  He underwent successful initial intervention with stenting of his proximal to mid RCA and PTCA of his PLA branch.  2 days later he underwent staged intervention to a complex LAD trifurcation stenosis with successful intervention with initial score flex balloon dilatation followed by insertion of 2.5 x 34 mm Medtronic Onyx frontiers stent.  At the completion of the procedure he had brisk TIMI-3 flow down the LAD and diagonal in his diagonal was unchanged with the 80% ostial stenosis prior to the sharp bend in the vessel.  He also had concomitant 70% distal circumflex stenosis.  Presently, he remains active  but at times admits to very rare sensation of chest tightness.  He continues to be  active and works at [employer].  He continues to be on DAPT.  Lipid studies on Oct 21, 2021  showed total cholesterol 172, triglycerides 230, HDL 39 and LDL 94.  At that time I added Vascepa as well as Zetia to his lipid treatment with recommended target LDL less than 55.  Presently, he has continued to do well and is without recurrent chest pain or shortness of breath.  Continues to work for [employer].  Most recent laboratory has shown improvement in his lipid studies with total cholesterol now 125, triglycerides 103, HDL 39, VLDL 19, and LDL cholesterol at 67.  However, he has a markedly elevated LP(a) at 213.3.  I have recommended target LDL less than 50 particularly with his significant LP(a) elevation which undoubtedly may have played a role in his development of prior vulnerable plaque and acute coronary syndrome.  I have contacted [facility] office to of our pharmacy team to see if he will qualify for Repatha initiation.  Blood pressure today is stable on current therapy.  He is not having any recurrent anginal symptomatology.  GERD is controlled with omeprazole.  He continues to take Effexor for depression.  I will see him in 4 to 5 months for follow-up evaluation or sooner as needed.   Medication Adjustments/Labs and Tests Ordered: Current medicines are reviewed at length with the patient today.  Concerns regarding medicines are outlined above.  Medication changes, Labs and Tests ordered today are listed in the Patient Instructions below. Patient Instructions   *If you need a refill on your cardiac medications before your next appointment, please call your pharmacy*   Lab Work: None If you have labs (blood work) drawn today and your tests are completely normal, you will receive your results only by: MyChart Message (if you have MyChart) OR A paper copy in the mail If you have any lab test that is abnormal or we need to  change your treatment, we will call you to review the results.   Testing/Procedures: None   Follow-Up: At [facility], you and your health needs are our priority.  As part of our continuing mission to provide you with exceptional heart care, we have created designated Provider Care Teams.  These Care Teams include your primary Cardiologist (physician) and Advanced Practice Providers (APPs -  Physician Assistants and Nurse Practitioners) who all work together to provide you with the care you need, when you need it.  We recommend signing up for the patient portal called "MyChart".  Sign up information is provided on this After Visit Summary.  MyChart is used to connect with patients for Virtual Visits (Telemedicine).  Patients are able to view lab/test results, encounter notes, upcoming appointments, etc.  Non-urgent messages can be sent to your provider as well.   To learn more about what you can do with MyChart, go to [email or web address].    Your next appointment:   5 month(s)  Provider:   [name], MD         Signed, [name], MD  11/20/2022 12:16 PM    [facility] [address], [address], [place], [place]  [address] Phone: [phone]

## 2022-11-18 LAB — COMPREHENSIVE METABOLIC PANEL
ALT: 43 IU/L (ref 0–44)
AST: 33 IU/L (ref 0–40)
Albumin/Globulin Ratio: 2
Albumin: 4.3 g/dL (ref 3.9–4.9)
Alkaline Phosphatase: 91 IU/L (ref 44–121)
BUN/Creatinine Ratio: 20 (ref 10–24)
BUN: 16 mg/dL (ref 8–27)
Bilirubin Total: 0.5 mg/dL (ref 0.0–1.2)
CO2: 18 mmol/L — ABNORMAL LOW (ref 20–29)
Calcium: 9.1 mg/dL (ref 8.6–10.2)
Chloride: 104 mmol/L (ref 96–106)
Creatinine, Ser: 0.81 mg/dL (ref 0.76–1.27)
Globulin, Total: 2.2 g/dL (ref 1.5–4.5)
Glucose: 113 mg/dL — ABNORMAL HIGH (ref 70–99)
Potassium: 4.4 mmol/L (ref 3.5–5.2)
Sodium: 141 mmol/L (ref 134–144)
Total Protein: 6.5 g/dL (ref 6.0–8.5)
eGFR: 100 mL/min/{1.73_m2} (ref >59)

## 2022-11-18 LAB — LIPOPROTEIN A (LPA): Lipoprotein (a): 213.3 nmol/L — ABNORMAL HIGH (ref <75.0)

## 2022-11-18 LAB — CBC
Hematocrit: 40.7 % (ref 37.5–51.0)
Hemoglobin: 13.7 g/dL (ref 13.0–17.7)
MCH: 28 pg (ref 26.6–33.0)
MCHC: 33.7 g/dL (ref 31.5–35.7)
MCV: 83 fL (ref 79–97)
Platelets: 345 10*3/uL (ref 150–450)
RBC: 4.9 x10E6/uL (ref 4.14–5.80)
RDW: 13 % (ref 11.6–15.4)
WBC: 6.7 10*3/uL (ref 3.4–10.8)

## 2022-11-18 LAB — LIPID PANEL
Chol/HDL Ratio: 3.2 ratio (ref 0.0–5.0)
Cholesterol, Total: 125 mg/dL (ref 100–199)
HDL: 39 mg/dL — ABNORMAL LOW (ref >39)
LDL Chol Calc (NIH): 67 mg/dL (ref 0–99)
Triglycerides: 103 mg/dL (ref 0–149)
VLDL Cholesterol Cal: 19 mg/dL (ref 5–40)

## 2022-11-18 LAB — TSH: TSH: 2.37 u[IU]/mL (ref 0.450–4.500)

## 2022-11-18 LAB — HEPATIC FUNCTION PANEL: Bilirubin, Direct: 0.14 mg/dL (ref 0.00–0.40)

## 2022-11-19 ENCOUNTER — Other Ambulatory Visit: Payer: Self-pay

## 2022-11-19 DIAGNOSIS — E782 Mixed hyperlipidemia: Secondary | ICD-10-CM

## 2022-11-20 ENCOUNTER — Telehealth: Payer: Self-pay | Admitting: Pharmacist Clinician (PhC)/ Clinical Pharmacy Specialist

## 2022-11-20 ENCOUNTER — Encounter: Payer: Self-pay | Admitting: Cardiovascular Disease

## 2022-11-20 ENCOUNTER — Ambulatory Visit: Payer: Managed Care, Other (non HMO) | Attending: Cardiovascular Disease | Admitting: Cardiovascular Disease

## 2022-11-20 DIAGNOSIS — E7841 Elevated Lipoprotein(a): Secondary | ICD-10-CM

## 2022-11-20 DIAGNOSIS — K219 Gastro-esophageal reflux disease without esophagitis: Secondary | ICD-10-CM

## 2022-11-20 DIAGNOSIS — R0602 Shortness of breath: Secondary | ICD-10-CM

## 2022-11-20 DIAGNOSIS — E782 Mixed hyperlipidemia: Secondary | ICD-10-CM

## 2022-11-20 DIAGNOSIS — I2111 ST elevation (STEMI) myocardial infarction involving right coronary artery: Secondary | ICD-10-CM | POA: Diagnosis not present

## 2022-11-20 DIAGNOSIS — Z9861 Coronary angioplasty status: Secondary | ICD-10-CM | POA: Diagnosis not present

## 2022-11-20 MED ORDER — METOPROLOL TARTRATE 75 MG PO TABS: ORAL_TABLET | ORAL | 3 refills | Status: DC

## 2022-11-20 MED ORDER — LOSARTAN POTASSIUM 50 MG PO TABS: 75.0000 mg | ORAL_TABLET | Freq: Every day | ORAL | 3 refills | Status: DC

## 2022-11-20 NOTE — Patient Instructions (Signed)
*  If you need a refill on your cardiac medications before your next appointment, please call your pharmacy*   Lab Work: None If you have labs (blood work) drawn today and your tests are completely normal, you will receive your results only by: MyChart Message (if you have MyChart) OR A paper copy in the mail If you have any lab test that is abnormal or we need to change your treatment, we will call you to review the results.   Testing/Procedures: None   Follow-Up: At [facility], you and your health needs are our priority.  As part of our continuing mission to provide you with exceptional heart care, we have created designated Provider Care Teams.  These Care Teams include your primary Cardiologist (physician) and Advanced Practice Providers (APPs -  Physician Assistants and Nurse Practitioners) who all work together to provide you with the care you need, when you need it.  We recommend signing up for the patient portal called "MyChart".  Sign up information is provided on this After Visit Summary.  MyChart is used to connect with patients for Virtual Visits (Telemedicine).  Patients are able to view lab/test results, encounter notes, upcoming appointments, etc.  Non-urgent messages can be sent to your provider as well.   To learn more about what you can do with MyChart, go to [email or web address].    Your next appointment:   5 month(s)  Provider:   [name], MD

## 2022-11-20 NOTE — Telephone Encounter (Signed)
Dr. Tresa Endo saw patient in office - wants to start on Repatha for Lp(a) at 213.  Currently on atorvastatin 80 mg and ezetimibe 10.    Hx of STEMI, with elevated Lp(a), LDL goal < 55.    Will see if Repatha covered

## 2022-11-23 ENCOUNTER — Other Ambulatory Visit (HOSPITAL_COMMUNITY): Payer: Self-pay

## 2022-11-23 ENCOUNTER — Telehealth: Payer: Self-pay

## 2022-11-23 NOTE — Telephone Encounter (Signed)
PA initiated, please see separate encounter for updates on determination. (I will route you back in once a decision has been made)  Camila Maita, CPhT Pharmacy Patient Advocate Specialist Direct Number: [phone] Fax: [phone]  

## 2022-11-23 NOTE — Addendum Note (Signed)
Addended by: Chana Bode on: 11/23/2022 12:32 PM   Modules accepted: Orders

## 2022-11-23 NOTE — Telephone Encounter (Signed)
Pharmacy Patient Advocate Encounter   Received notification from [facility] that prior authorization for REPATHA 140 MG/ML INJ is needed.    PA submitted on 11/23/22 Key [id number] Status is pending  Haze Rushing, CPhT Pharmacy Patient Advocate Specialist Direct Number: [phone] Fax: [phone]

## 2022-11-25 NOTE — Telephone Encounter (Signed)
Pharmacy Patient Advocate Encounter  Prior Authorization for REPATHA has been approved.    PA# [id number] Effective dates: 11/23/22 through 05/25/23  Haze Rushing, CPhT Pharmacy Patient Advocate Specialist Direct Number: [phone] Fax: [phone]

## 2022-11-26 MED ORDER — REPATHA SURECLICK 140 MG/ML ~~LOC~~ SOAJ: 140.0000 mg | SUBCUTANEOUS | 3 refills | Status: DC

## 2022-11-26 NOTE — Addendum Note (Signed)
Addended by: Rosalee Kaufman on: 11/26/2022 12:55 PM   Modules accepted: Orders

## 2022-11-26 NOTE — Telephone Encounter (Signed)
Spoke with patient.  He will go to [email or web address] to apply for $5 copay card.     Will repeat labs after 5-6 doses

## 2022-12-08 ENCOUNTER — Other Ambulatory Visit: Payer: Self-pay | Admitting: Cardiovascular Disease

## 2023-01-03 ENCOUNTER — Other Ambulatory Visit: Payer: Self-pay | Admitting: Cardiovascular Disease

## 2023-01-21 ENCOUNTER — Other Ambulatory Visit: Payer: Self-pay | Admitting: Cardiovascular Disease

## 2023-02-15 ENCOUNTER — Other Ambulatory Visit: Payer: Self-pay | Admitting: Cardiovascular Disease

## 2023-03-10 ENCOUNTER — Telehealth (HOSPITAL_BASED_OUTPATIENT_CLINIC_OR_DEPARTMENT_OTHER): Payer: Self-pay | Admitting: Pharmacist Clinician (PhC)/ Clinical Pharmacy Specialist

## 2023-03-10 DIAGNOSIS — E782 Mixed hyperlipidemia: Secondary | ICD-10-CM

## 2023-03-17 NOTE — Addendum Note (Signed)
Addended by: Rosalee Kaufman on: 03/17/2023 01:30 PM   Modules accepted: Orders

## 2023-03-17 NOTE — Telephone Encounter (Signed)
Labs ordered - Lipid, liver, Lp(a)

## 2023-04-21 LAB — LIPID PANEL
Chol/HDL Ratio: 1.5 {ratio} (ref 0.0–5.0)
Cholesterol, Total: 67 mg/dL — ABNORMAL LOW (ref 100–199)
HDL: 44 mg/dL (ref >39)
LDL Chol Calc (NIH): 7 mg/dL (ref 0–99)
Triglycerides: 73 mg/dL (ref 0–149)
VLDL Cholesterol Cal: 16 mg/dL (ref 5–40)

## 2023-04-22 LAB — HEPATIC FUNCTION PANEL
ALT: 45 [IU]/L — ABNORMAL HIGH (ref 0–44)
AST: 32 [IU]/L (ref 0–40)
Albumin: 4.4 g/dL (ref 3.9–4.9)
Alkaline Phosphatase: 91 [IU]/L (ref 44–121)
Bilirubin Total: 1 mg/dL (ref 0.0–1.2)
Bilirubin, Direct: 0.45 mg/dL — ABNORMAL HIGH (ref 0.00–0.40)
Total Protein: 6.7 g/dL (ref 6.0–8.5)

## 2023-04-22 LAB — LIPOPROTEIN A (LPA): Lipoprotein (a): 100.6 nmol/L — ABNORMAL HIGH (ref <75.0)

## 2023-04-23 ENCOUNTER — Encounter: Payer: Self-pay | Admitting: Cardiovascular Disease

## 2023-04-23 ENCOUNTER — Ambulatory Visit: Payer: Managed Care, Other (non HMO) | Attending: Cardiovascular Disease | Admitting: Cardiovascular Disease

## 2023-04-23 VITALS — BP 120/82 | HR 58 | Ht 68.0 in | Wt 205.4 lb

## 2023-04-23 DIAGNOSIS — E7841 Elevated Lipoprotein(a): Secondary | ICD-10-CM

## 2023-04-23 DIAGNOSIS — E782 Mixed hyperlipidemia: Secondary | ICD-10-CM | POA: Diagnosis not present

## 2023-04-23 DIAGNOSIS — Z9861 Coronary angioplasty status: Secondary | ICD-10-CM | POA: Diagnosis not present

## 2023-04-23 DIAGNOSIS — I2111 ST elevation (STEMI) myocardial infarction involving right coronary artery: Secondary | ICD-10-CM | POA: Diagnosis not present

## 2023-04-23 DIAGNOSIS — K219 Gastro-esophageal reflux disease without esophagitis: Secondary | ICD-10-CM

## 2023-04-23 NOTE — Patient Instructions (Addendum)
Medication Instructions:  Stop Ezetimibe (Zetia) *If you need a refill on your cardiac medications before your next appointment, please call your pharmacy*  Follow-Up: At [facility], you and your health needs are our priority.  As part of our continuing mission to provide you with exceptional heart care, we have created designated Provider Care Teams.  These Care Teams include your primary Cardiologist (physician) and Advanced Practice Providers (APPs -  Physician Assistants and Nurse Practitioners) who all work together to provide you with the care you need, when you need it.  We recommend signing up for the patient portal called "MyChart".  Sign up information is provided on this After Visit Summary.  MyChart is used to connect with patients for Virtual Visits (Telemedicine).  Patients are able to view lab/test results, encounter notes, upcoming appointments, etc.  Non-urgent messages can be sent to your provider as well.   To learn more about what you can do with MyChart, go to [email or web address].    Your next appointment:    6-8 months  Provider:   Dr Cristal Deer End

## 2023-04-23 NOTE — Progress Notes (Signed)
Cardiology Office Note    Date:  05/03/2023   ID:  [name], DOB 1960/08/14, MRN [id number]  PCP:  Eric Mina, MD  Cardiologist:  [name], MD   5 month F/U office visit  History of Present Illness:  Eric Anderson is a 62 y.o. male with known CAD, status post MI, mixed hyperlipidemia, and GERD who presents for a 72-month follow-up cardiology evaluation.   Eric Anderson presented to [facility] July 12, 2021 with transfer from [facility] ER where he presented with inferolateral ST segment elevation myocardial infarction.  At that time the Cath Lab at [facility] was occupied and he was transferred to [facility].  Upon arrival he was taken emergently to the cardiac catheterization laboratory where I performed cardiac catheterization and successful PCI to a large dominant totally occluded proximal RCA with insertion of a 3.5 x 26 mm Medtronic Onyx frontiers stent.  There was significant thrombus burden for which Aggrastat was started.  With initial reperfusion he became bradycardic with junctional rhythm treated with atropine and due to hypotension transiently required Levophed in addition to IV fluid bolus.  He also at the time underwent PTCA of a totally occluded large PLA branch with restoration of TIMI-3 flow.  At the time of the catheterization he also had high-grade complex proximal and mid trifurcation LAD stenoses in addition to 70% circumflex stenosis.  Two days later on July 14, 2021 he underwent successful staged PCI with scoring balloon and insertion of a 2.5 x 34 mm Medtronic Onyx frontiers stent to cover both LAD lesions.  There was an 80% ostial diagonal stenosis which was not intervened upon.  His circumflex lesion was treated medically.  An echo Doppler study on July 12, 2021 showed EF at 50 to 55% with moderately reduced RV function consistent with a component of RV infarction secondary to his large RCA occlusion.  Subsequently, he did well and  was discharged on July 15, 2021 on aspirin/Brilinta for DAPT, atorvastatin 80 mg, metoprolol tartrate 12.5 mg twice a day, in addition to his prior Effexor XR and finasteride.  He was evaluated by Edd Fabian, NP on July 24, 2021 for his post MI initial evaluation and denied any recurrent chest pain symptomatology.  I saw him for my initial office evaluation on Oct 29, 2021.  He felt well and denied any chest pain or shortness of breath.  He works at [employer].  Laboratory on Oct 21, 2021 showed total cholesterol 172, triglycerides 230, HDL 39, and LDL 94 despite taking atorvastatin 80 mg and fenofibrate 120 mg.  He continues to be on DAPT with aspirin/Brilinta.  He continues to be on metoprolol 12.5 mg twice a day.  During that evaluation I added Vascepa 2 g twice a day to take along with atorvastatin 80 mg, fenofibrate 120 mg.  I also added Zetia 10 mg daily.  I recommended follow-up laboratory with LP(a).  I saw him on September 03, 2022 at which time he felt well and denied any recurrent chest pain.  An echo Doppler study on July 15, 2022 showed EF 55 to 60% with normal diastolic parameters.  There were no wall motion abnormalities.  He never followed up with his laboratory as recommended.  He continues to be on aspirin/Brilinta for DAPT.  He is on atorvastatin 80 mg, Zetia 10 mg, and Vascepa 2 capsules twice a day for mixed hyperlipidemia.  He is on metoprolol to tartrate 12.5 mg twice a day for  blood pressure and his CAD.  He takes omeprazole for GERD.  He also is on Effexor XR 150 mg daily.  With his concomitant CAD I recommended slight titration of metoprolol and discussed possible addition of PCSK9 inhibition if he was unable to achieve a target LDL less than 55.  I last saw him on November 20, 2022.   He continues to work at [employer].  He denies any recurrent chest pain symptomatology.  He continues to be on DAPT with aspirin/Brilinta.  He is on atorvastatin 80 mg, Zetia 10 mg, and Vascepa 2  capsules twice a day for mixed hyperlipidemia.  May 2023 LDL cholesterol was elevated at 94 with triglycerides 230.  Most recent laboratory from November 17, 2022 now shows significant benefit with total cholesterol 125, triglycerides 103, HDL 39, and LDL now at 67.  I also checked LP(a) which is significantly elevated at 213.3.  He continues to be on Effexor 150 mg daily and omeprazole 20 mg daily for GERD.  During that evaluation, with his elevated LP(a) at 213.3 I recommended target LDL less than 50 and his significant LPA elevation may have played a role in his development of prior vulnerable plaque and acute coronary syndrome.  Since I last saw him, he has continued to do well.  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  He is now on atorvastatin 80 mg, Zetia 10 mg, Vascepa 2 capsules twice a day and Repatha injection every 2 weeks.  He remains asymptomatic.  He continues to be on aspirin.  Recent laboratory on April 21, 2023 now shows dramatic lipid reduction with total cholesterol 67, triglycerides 73, HDL 44, VLDL 16, and LDL cholesterol is now 7.  He presents for evaluation  Past Medical History:  Diagnosis Date   History of BPH    Hyperlipidemia    Urine retention     Past Surgical History:  Procedure Laterality Date   ANTERIOR FUSION CERVICAL SPINE     COLONOSCOPY WITH PROPOFOL N/A 05/15/2020   Procedure: COLONOSCOPY WITH PROPOFOL;  Surgeon: Toledo, Boykin Nearing, MD;  Location: [facility] ENDOSCOPY;  Service: Gastroenterology;  Laterality: N/A;   CORONARY STENT INTERVENTION N/A 07/14/2021   Procedure: CORONARY STENT INTERVENTION;  Surgeon: Lennette Bihari, MD;  Location: [facility] INVASIVE CV LAB;  Service: Cardiovascular;  Laterality: N/A;   CORONARY/GRAFT ACUTE MI REVASCULARIZATION N/A 07/12/2021   Procedure: Coronary/Graft Acute MI Revascularization;  Surgeon: Lennette Bihari, MD;  [facility] CV LAB;  Service: Cardiovascular;  Laterality: N/A;   LAPAROSCOPIC APPENDECTOMY N/A 02/20/2015    Procedure: APPENDECTOMY LAPAROSCOPIC;  Surgeon: Tiney Rouge III, MD;  Location: [facility] ORS;  Service: General;  Laterality: N/A;   LEFT HEART CATH N/A 07/14/2021   Procedure: Left Heart Cath;  Surgeon: Lennette Bihari, MD;  Location: [facility] INVASIVE CV LAB;  Service: Cardiovascular;  Laterality: N/A;   LEFT HEART CATH AND CORONARY ANGIOGRAPHY N/A 07/12/2021   Procedure: LEFT HEART CATH AND CORONARY ANGIOGRAPHY;  Surgeon: Lennette Bihari, MD;  Location: [facility] INVASIVE CV LAB;  Service: Cardiovascular;  Laterality: N/A;    Current Medications: Outpatient Medications Prior to Visit  Medication Sig Dispense Refill   ASPIRIN LOW DOSE 81 MG chewable tablet CHEW 1 TABLET BY MOUTH DAILY. 90 tablet 3   atorvastatin (LIPITOR) 80 MG tablet TAKE 1 TABLET BY MOUTH DAILY 90 tablet 3   B Complex-C (SUPER B COMPLEX PO) Take 1 tablet by mouth daily.     BRILINTA 90 MG TABS tablet TAKE 1  TABLET BY MOUTH TWICE  DAILY 60 tablet 11   Evolocumab (REPATHA SURECLICK) 140 MG/ML SOAJ Inject 140 mg into the skin every 14 (fourteen) days. 6 mL 3   finasteride (PROSCAR) 5 MG tablet Take 5 mg by mouth daily.     Flaxseed, Linseed, (FLAXSEED OIL PO) Take 1 capsule by mouth 3 (three) times daily.     ibuprofen (ADVIL,MOTRIN) 200 MG tablet Take 400 mg by mouth every 6 (six) hours as needed for mild pain.     icosapent Ethyl (VASCEPA) 1 g capsule TAKE 2 CAPSULES BY MOUTH TWICE  DAILY 180 capsule 7   Metoprolol Tartrate 75 MG TABS Take 75 mg by mouth daily.     omeprazole (PRILOSEC) 20 MG capsule Take 20 mg by mouth daily.     sildenafil (VIAGRA) 100 MG tablet Take by mouth.     venlafaxine XR (EFFEXOR-XR) 150 MG 24 hr capsule Take 150 mg by mouth daily with breakfast.     ezetimibe (ZETIA) 10 MG tablet TAKE 1 TABLET BY MOUTH DAILY 90 tablet 2   nitroGLYCERIN (NITROSTAT) 0.4 MG SL tablet Place 1 tablet (0.4 mg total) under the tongue every 5 (five) minutes as needed for chest pain. For up to 3 doses 25 tablet 4   No facility-administered  medications prior to visit.     Allergies:   Patient has no known allergies.   Social History   Socioeconomic History   Marital status: Married    Spouse name: Not on file   Number of children: Not on file   Years of education: Not on file   Highest education level: Not on file  Occupational History   Not on file  Tobacco Use   Smoking status: Never   Smokeless tobacco: Never  Vaping Use   Vaping status: Never Used  Substance and Sexual Activity   Alcohol use: Yes    Alcohol/week: 0.0 standard drinks of alcohol    Comment: occasional   Drug use: No   Sexual activity: Not on file  Other Topics Concern   Not on file  Social History Narrative   Not on file   Social Determinants of Health   Financial Resource Strain: Not on file  Food Insecurity: Not on file  Transportation Needs: Not on file  Physical Activity: Not on file  Stress: Not on file  Social Connections: Not on file     Family History:  The patient's family history includes Diabetes in his brother and sister; Heart disease in his father.   ROS General: Negative; No fevers, chills, or night sweats;  HEENT: Negative; No changes in vision or hearing, sinus congestion, difficulty swallowing Pulmonary: Negative; No cough, wheezing, shortness of breath, hemoptysis Cardiovascular: Negative; No chest pain, presyncope, syncope, palpitations GI: Negative; No nausea, vomiting, diarrhea, or abdominal pain GU: Negative; No dysuria, hematuria, or difficulty voiding Musculoskeletal: Negative; no myalgias, joint pain, or weakness Hematologic/Oncology: Negative; no easy bruising, bleeding Endocrine: Negative; no heat/cold intolerance; no diabetes Neuro: Negative; no changes in balance, headaches Skin: Negative; No rashes or skin lesions Psychiatric: On Effexor Sleep: Negative; No snoring, daytime sleepiness, hypersomnolence, bruxism, restless legs, hypnogognic hallucinations, no cataplexy Other comprehensive 14 point  system review is negative.   PHYSICAL EXAM:   VS:  BP 120/82 (BP Location: Left Arm, Patient Position: Sitting, Cuff Size: Large)   Pulse (!) 58   Ht 5\' 8"  (1.727 m)   Wt 205 lb 6.4 oz (93.2 kg)   SpO2 94%   BMI  31.23 kg/m     Repeat blood pressure by me 112/78  Wt Readings from Last 3 Encounters:  04/23/23 205 lb 6.4 oz (93.2 kg)  11/20/22 207 lb 9.6 oz (94.2 kg)  09/03/22 201 lb 3.2 oz (91.3 kg)   General: Alert, oriented, no distress.  Skin: normal turgor, no rashes, warm and dry HEENT: Normocephalic, atraumatic. Pupils equal round and reactive to light; sclera anicteric; extraocular muscles intact;  Nose without nasal septal hypertrophy Mouth/Parynx benign; Mallinpatti scale 3 Neck: No JVD, no carotid bruits; normal carotid upstroke Lungs: clear to ausculatation and percussion; no wheezing or rales Chest wall: without tenderness to palpitation Heart: PMI not displaced, RRR, s1 s2 normal, 1/6 systolic murmur, no diastolic murmur, no rubs, gallops, thrills, or heaves Abdomen: soft, nontender; no hepatosplenomehaly, BS+; abdominal aorta nontender and not dilated by palpation. Back: no CVA tenderness Pulses 2+ Musculoskeletal: full range of motion, normal strength, no joint deformities Extremities: no clubbing cyanosis or edema, Homan's sign negative  Neurologic: grossly nonfocal; Cranial nerves grossly wnl Psychologic: Normal mood and affect      Studies/Labs Reviewed:    EKG Interpretation Date/Time:  Friday April 23 2023 09:41:20 EST Ventricular Rate:  58 PR Interval:  146 QRS Duration:  82 QT Interval:  402 QTC Calculation: 394 R Axis:   -27  Text Interpretation: Sinus bradycardia Minimal voltage criteria for LVH, may be normal variant ( R in aVL ) Inferior infarct (cited on or before 12-Jul-2021) Cannot rule out Anterior infarct , age undetermined When compared with ECG of 15-Jul-2021 06:52, PR interval has decreased T wave inversion less evident in Inferior  leads T wave inversion no longer evident in Anterolateral leads QT has shortened Confirmed by [name] ([id number]) on 04/23/2023 10:21:20 AM      November 20, 2022 ECG (independently read by me): NSR at 43, old IMI with inferior Q waves  September 03, 2022 ECG (independently read by me): NSR at 61, Inferior Q waves c/w old inferior MI  Oct 29, 2021 ECG (independently read by me): NSR at 62, Inferior MI, no ectopy, normal intervals  Recent Labs:    Latest Ref Rng & Units 11/17/2022    9:08 AM 07/15/2021    1:03 AM 07/14/2021    2:39 AM  BMP  Glucose 70 - 99 mg/dL 191  478  295   BUN 8 - 27 mg/dL 16  12  10    Creatinine 0.76 - 1.27 mg/dL 6.21  3.08  6.57   BUN/Creat Ratio 10 - 24 20     Sodium 134 - 144 mmol/L 141  139  136   Potassium 3.5 - 5.2 mmol/L 4.4  4.5  3.9   Chloride 96 - 106 mmol/L 104  109  107   CO2 20 - 29 mmol/L 18  18  21    Calcium 8.6 - 10.2 mg/dL 9.1  8.6  8.5         Latest Ref Rng & Units 04/21/2023    9:17 AM 11/17/2022    9:08 AM 07/12/2021    1:26 PM  Hepatic Function  Total Protein 6.0 - 8.5 g/dL 6.7  6.5  6.6   Albumin 3.9 - 4.9 g/dL 4.4  4.3  3.9   AST 0 - 40 IU/L 32  33  32   ALT 0 - 44 IU/L 45  43  34   Alk Phosphatase 44 - 121 IU/L 91  91  68   Total Bilirubin 0.0 - 1.2 mg/dL 1.0  0.5  0.8   Bilirubin, Direct 0.00 - 0.40 mg/dL 3.24  4.01         Latest Ref Rng & Units 11/17/2022    9:08 AM 10/21/2021   10:07 AM 07/15/2021    1:03 AM  CBC  WBC 3.4 - 10.8 x10E3/uL 6.7  7.0  13.5   Hemoglobin 13.0 - 17.7 g/dL 02.7  25.3  66.4   Hematocrit 37.5 - 51.0 % 40.7  46.1  35.5   Platelets 150 - 450 x10E3/uL 345  358  292    Lab Results  Component Value Date   MCV 83 11/17/2022   MCV 84 10/21/2021   MCV 84.3 07/15/2021   Lab Results  Component Value Date   TSH 2.370 11/17/2022   Lab Results  Component Value Date   HGBA1C 5.6 07/12/2021     BNP No results found for: "BNP"  ProBNP No results found for: "PROBNP"   Lipid Panel     Component Value  Date/Time   CHOL 67 (L) 04/21/2023 0911   TRIG 73 04/21/2023 0911   HDL 44 04/21/2023 0911   CHOLHDL 1.5 04/21/2023 0911   CHOLHDL 5.9 07/12/2021 1326   VLDL 18 07/12/2021 1326   LDLCALC 7 04/21/2023 0911   LABVLDL 16 04/21/2023 0911     RADIOLOGY: No results found.   Additional studies/ records that were reviewed today include:   CATH/PCI: 07/12/2021   Prox RCA lesion is 100% stenosed.   3rd RPL lesion is 100% stenosed.   2nd RPL lesion is 70% stenosed.   2nd Diag lesion is 75% stenosed.   Mid Cx to Dist Cx lesion is 70% stenosed.   Mid LAD-1 lesion is 80% stenosed with 80% stenosed side branch in 2nd Sept.   Mid LAD-2 lesion is 50% stenosed.   RPDA lesion is 40% stenosed.   A drug-eluting stent was successfully placed.   Post intervention, there is a 0% residual stenosis.   Post intervention, there is a 0% residual stenosis.   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   Multivessel CAD with mild coronary calcification of the LAD at the ostium with 50% proximal stenosis, complex 80 and 75% stenosis of the proximal to mid LAD and ostial diagonal vessel with 50% mid LAD stenosis; 50% proximal stenosis in the ramus intermediate vessel; 70% proximal circumflex stenosis with total occlusion disease (appears chronic) of the mid vessel after small marginal branches with very faint filling of diminutive small branches distally.   LVEDP 12 mmHg.   Successful percutaneous coronary intervention of the large dominant RCA with PTCA and stenting of the diffusely diseased proximal RCA with insertion of a 3.5 x 26 mm Medtronic Onyx frontiers stent postdilated to 3.78 mm with the proximal stenosis being reduced to 0%.  There was significant thrombus burden for which Aggrastat was started.  In addition, with initial reperfusion, the patient became bradycardic with junctional rhythm treated with atropine 0.5 mg x 2 and due to  hypotension with blood pressure dropping into the 60s received IV fluid bolus and Levophed inotropic therapy.    Successful PTCA of the totally occluded PLA branch with restoration of TIMI-3 flow.   RECOMMENDATION: DAPT for minimum of 1 year but most likely longer.  Initiate medical therapy for concomitant CAD.  Will ask colleagues to review concerning potential need for LAD intervention following stability.  Suspect the circumflex occlusion is chronic and will treat medically.  Aggressive  lipid-lowering therapy with target LDL less than 70.  A 2D echo Doppler study will be obtained in a.m. for assessment of LV function.    Intervention     CATH/PCI: 07/14/2021   Mid LAD-1 lesion is 80% stenosed with 80% stenosed side branch in 2nd Sept.   Mid LAD-2 lesion is 70% stenosed.   Mid Cx to Dist Cx lesion is 70% stenosed.   2nd Diag lesion is 80% stenosed.   A drug-eluting stent was successfully placed.   Post intervention, there is a 0% residual stenosis.   Post intervention, there is a 0% residual stenosis.   Post intervention, the side branch was reduced to 0% residual stenosis.   Successful PCI involving complex stenosis in the proximal LAD at the takeoff of the septal and diagonal vessel with 80% ostial diagonal stenosis with a sharp angled segment and 70% mid stenosis of the LAD.  A score flex balloon was initially used at the complex LAD stenosis involving the trifurcation of septal and diagonal vessel to reduce potential for plaque shifting.  Following insertion of a 2.5 x 34 mm Medtronic Onyx Frontier stent postdilated to 2.84 proximally to 2.72 distally, the stenosis was reduced to 0%.  At the completion of the procedure there was TIMI-3 flow down the LAD as well as the diagonal vessel but there continued to be 80-85% ostial diagonal stenosis prior to the sharp bend in the vessel.    RECOMMENDATION: In this patient who is status post acute PCI to a totally occluded RCA treated  successfully on July 11, 2021 recommend DAPT for minimum of 12 months but probably indefinitely.  Increase medical therapy for concomitant CAD with  chronic occlusion of the circumflex and residual diagonal disease.    Intervention     ECHO: 07/13/2021  1. Mid / basal inferior hypokinesis . Left ventricular ejection fraction,  by estimation, is 50 to 55%. The left ventricle has low normal function.  The left ventricle demonstrates regional wall motion abnormalities (see  scoring diagram/findings for  description). There is moderate left ventricular hypertrophy. Left  ventricular diastolic parameters were normal.   2. ? RV infarction . Right ventricular systolic function is moderately  reduced. The right ventricular size is moderately enlarged.   3. Right atrial size was mildly dilated.   4. The mitral valve is abnormal. Trivial mitral valve regurgitation. No  evidence of mitral stenosis.   5. The aortic valve is tricuspid. There is mild calcification of the  aortic valve. Aortic valve regurgitation is not visualized. Aortic valve  sclerosis is present, with no evidence of aortic valve stenosis.   6. The inferior vena cava is dilated in size with >50% respiratory  variability, suggesting right atrial pressure of 8 mmHg.    ECHO: 07/15/2022  1. Left ventricular ejection fraction, by estimation, is 55 to 60%. The  left ventricle has normal function. The left ventricle has no regional  wall motion abnormalities. Left ventricular diastolic parameters were  normal. The average left ventricular  global longitudinal strain is -17.7 %. The global longitudinal strain is  normal.   2. Right ventricular systolic function is normal. The right ventricular  size is normal.   3. The mitral valve is normal in structure. Trivial mitral valve  regurgitation.   4. The aortic valve is grossly normal. Aortic valve regurgitation is not  visualized.   5. The inferior vena cava is normal in size with  greater than 50%  respiratory variability, suggesting right atrial pressure  of 3 mmHg.   ASSESSMENT:    1. STEMI involving right coronary artery [facility]): July 12, 2021   2. History of percutaneous coronary intervention: RCA, LAD   3. Mixed hyperlipidemia   4. 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 Gastroesophageal reflux disease without esophagitis     PLAN:  Mr. Mell Thane is a 62 year old gentleman who developed an acute inferolateral large myocardial infarction in July 12, 2021.  He was transferred to [facility] do to the occupied Cath Lab at [facility] and was found to have total proximal RCA occlusion. There was extensive thrombus burden and total occlusion of a large distal PLA branch.  He underwent successful initial intervention with stenting of his proximal to mid RCA and PTCA of his PLA branch. Two days later he underwent staged intervention to a complex LAD trifurcation stenosis with successful intervention with initial score flex balloon dilatation followed by insertion of 2.5 x 34 mm Medtronic Onyx frontiers stent.  At the completion of the procedure he had brisk TIMI-3 flow down the LAD and diagonal in his diagonal was unchanged with the 80% ostial stenosis prior to the sharp bend in the vessel.  He also had concomitant 70% distal circumflex stenosis.  Subsequently, he had remained essentially chest pain-free.  He continues to be active and works at [employer].  He continues to be on DAPT.  Lipid studies on Oct 21, 2021  showed total cholesterol 172, triglycerides 230, HDL 39 and LDL 94.  At that time I added Vascepa as well as Zetia to his lipid treatment with recommended target LDL less than 55.  Presently, he has continued to do well and is without recurrent chest pain or shortness of breath.  He continues to work for [employer].  Subsequent laboratory has shown improvement in his lipid studies with total cholesterol now 125, triglycerides 103, HDL 39, VLDL 19, and LDL cholesterol at 67.  However, he  has a markedly elevated LP(a) at 213.3.  I have recommended target LDL less than 50 particularly with his significant LP(a) elevation which undoubtedly may have played a role in his development of prior vulnerable plaque and acute coronary syndrome.  At his June 2024 evaluation I contacted Bernell List our pharmacist to see if he would qualify for Repatha.  He subsequently was started on Repatha therapy.  Most recent lipid panel from April 21, 2023 now showed dramatic benefit with total cholesterol 67, triglycerides 73, and LDL cholesterol now at 7.  She has seen these and had suggested possible discontinuance of Zetia.  Clinically he remains asymptomatic on current therapy.  Blood pressure today is well-controlled.  GERD is controlled with omeprazole.  He takes Effexor for depression.  I discussed with him my plans for future retirement in 2025.  He lives in the [place] area.  He will continue current therapy and I have recommended transition of care to Dr. Thayer Ohm End in the [place] office in 6 to 8 months.    Medication Adjustments/Labs and Tests Ordered: Current medicines are reviewed at length with the patient today.  Concerns regarding medicines are outlined above.  Medication changes, Labs and Tests ordered today are listed in the Patient Instructions below. Patient Instructions  Medication Instructions:  Stop Ezetimibe (Zetia) *If you need a refill on your cardiac medications before your next appointment, please call your pharmacy*  Follow-Up: At [facility], you and your health needs are our priority.  As part of our continuing mission to provide you with exceptional heart care, we  have created designated Provider Care Teams.  These Care Teams include your primary Cardiologist (physician) and Advanced Practice Providers (APPs -  Physician Assistants and Nurse Practitioners) who all work together to provide you with the care you need, when you need it.  We recommend signing  up for the patient portal called "MyChart".  Sign up information is provided on this After Visit Summary.  MyChart is used to connect with patients for Virtual Visits (Telemedicine).  Patients are able to view lab/test results, encounter notes, upcoming appointments, etc.  Non-urgent messages can be sent to your provider as well.   To learn more about what you can do with MyChart, go to [email or web address].    Your next appointment:    6-8 months  Provider:   Dr Cristal Deer End     Signed, [name], MD  05/03/2023 6:42 PM    [facility] [address], [address], [place], [place]  [address] Phone: [phone]

## 2023-05-03 ENCOUNTER — Encounter: Payer: Self-pay | Admitting: Cardiovascular Disease

## 2023-05-20 ENCOUNTER — Telehealth: Payer: Self-pay | Admitting: Cardiovascular Disease

## 2023-05-20 MED ORDER — METOPROLOL TARTRATE 25 MG PO TABS: ORAL_TABLET | ORAL | Status: DC

## 2023-05-20 NOTE — Telephone Encounter (Signed)
Called and spoke to patient concerning his Metoprolol. Patient is calling to clarify dose he is suppose to be taking. He reports [facility] has [facility] taking Met[facility]rolol 75 mg  Take 25 mg ( whole tablet) in the morning and 12.5 mg ( 1/2 tablet) in the evening.)   [facility] has Metoprolol 25 mg  Take 25 mg ( whole tablet) in the morning and 12.5 mg ( 1/2 tablet) in the evening)   Patient's chart has Metoprolol 25 mg twice daily. Patient stated he has been taking Metoprolol 25 mg  Take 25 mg ( whole tablet) in the morning and 12.5 mg ( 1/2 tablet) in the evening   After talking to Dr Tresa Endo to clarify dosage he want patient to continue with the dose he has been taking Metoprolol 25 mg  Take 25 mg ( whole tablet) in the morning and 12.5 mg ( 1/2 tablet) in the evening). Patient verbalized understanding.

## 2023-05-20 NOTE — Telephone Encounter (Signed)
Pt c/o medication issue:  1. Name of Medication:Metoprolol Tartrate 75 MG TABS   2. How are you currently taking this medication (dosage and times per day)?  Take 75 mg by mouth daily.       3. Are you having a reaction (difficulty breathing--STAT)? N o   4. What is your medication issue? Pt is requesting a callback regarding him needing clarity on this medication since he has 2 different dosages. Please advise.

## 2023-07-10 ENCOUNTER — Other Ambulatory Visit: Payer: Self-pay | Admitting: Cardiovascular Disease

## 2023-07-13 ENCOUNTER — Telehealth (HOSPITAL_BASED_OUTPATIENT_CLINIC_OR_DEPARTMENT_OTHER): Payer: Self-pay

## 2023-07-13 NOTE — Telephone Encounter (Signed)
Patient called to request verbal dental clearance for teeth cleaning. Patient will call back 07/14/23 to clear with [facility].

## 2023-07-15 NOTE — Telephone Encounter (Signed)
 Patient was calling back to provide the number with the [phone] [facility]. Please advise

## 2023-07-16 ENCOUNTER — Telehealth: Payer: Self-pay | Admitting: Cardiovascular Disease

## 2023-07-16 NOTE — Telephone Encounter (Signed)
 Faxed Dr. Arlana Bellini clearance statement regarding patient's dental visit.

## 2023-07-16 NOTE — Telephone Encounter (Signed)
 OK fo dental cleaning; can hold brilinta  the morning of cleaning, then resume. Magnus Schuller

## 2023-07-16 NOTE — Telephone Encounter (Signed)
 Faxed dental clearance statement from [facility]. Spoke to [place] from [employer]. Informed me that they may or may not get the clearance due to faulty fax machine. Asked to email clearance. Told her I will only share patient info via  fax. Faxed to [phone].

## 2023-07-21 ENCOUNTER — Telehealth: Payer: Self-pay | Admitting: Cardiovascular Disease

## 2023-07-21 NOTE — Telephone Encounter (Signed)
I spoke with an employee of dental office and was told that their fax machine was not the best and that they may or may not receive what was sent. I relayed this message to the patient just for him to be aware. I informed the dental office rep along with the patient that we cannot email the clearance due to HIPAA.

## 2023-07-21 NOTE — Telephone Encounter (Signed)
Patient stated his dental office did not receive the clearance fax for his dental work this morning.  Patient is requesting the paperwork be emailed to:  [email or web address] or fax# [phone]. [facility] Dental phone number - Ph# [phone].

## 2023-08-02 ENCOUNTER — Telehealth: Payer: Self-pay | Admitting: *Deleted

## 2023-08-02 NOTE — Telephone Encounter (Signed)
   Patient Name: Eric Anderson  DOB: June 27, 1960 MRN: [id number]  Primary Cardiologist: [name], MD  Chart reviewed as part of pre-operative protocol coverage. Given past medical history and time since last visit, based on ACC/AHA guidelines, Eric Anderson is at acceptable risk for the planned procedure without further cardiovascular testing.   The patient was advised that if he develops new symptoms prior to surgery to contact our office to arrange for a follow-up visit, and he verbalized understanding.  OK fo dental cleaning; can hold brilinta the morning of cleaning, then resume. [name]       I will route this recommendation to the requesting party via Epic fax function and remove from pre-op pool.  Please call with questions.  Napoleon Form, Leodis Rains, NP 08/02/2023, 1:45 PM

## 2023-08-02 NOTE — Telephone Encounter (Signed)
 Patient was able to give the fax number of [facility]. Need clearance faxed over immediately to [phone]

## 2023-08-02 NOTE — Telephone Encounter (Signed)
 Patient calling requesting that refax info office. Dental offices havent received the fax. Please advise

## 2023-08-02 NOTE — Telephone Encounter (Signed)
 I tried to call the DDS office to confirm fax #. Our protocol is that we do not email clearance generally as to unable to confirm if secure email.  It also seems that no official clearance request was received and entered into the pt's chart.   Though I see noted per Dr. Tresa Endo he has cleared the pt and ok to hold Brilinta if needed. I will re-fax these notes again. I called the DDS office to see if they had another fax # and they do not. It was proposed if we could ermail it that would be great. I stated that is not our protocol. I will try to fax again per [facility] sometimes just have to keep trying.     Telephone 07/16/2023 [facility], Clovis Pu, MD Cardiology   All Conversations (Newest Message First)July 16, 2023 Shon Hale, [place]     07/16/23  9:03 AM Note Faxed dental clearance statement from [facility]. Spoke to [place] from [employer]. Informed me that they may or may not get the clearance due to faulty fax machine. Asked to email clearance. Told her I will only share patient info via  fax. Faxed to [phone].     Lennette Bihari, MD     07/16/23  8:21 AM Note OK fo dental cleaning; can hold brilinta the morning of cleaning, then resume. [name]

## 2023-08-05 ENCOUNTER — Other Ambulatory Visit: Payer: Self-pay | Admitting: Cardiovascular Disease

## 2023-08-26 ENCOUNTER — Encounter: Payer: Self-pay | Admitting: Cardiovascular Disease

## 2023-09-12 ENCOUNTER — Other Ambulatory Visit: Payer: Self-pay | Admitting: Cardiovascular Disease

## 2023-09-16 ENCOUNTER — Other Ambulatory Visit: Payer: Self-pay | Admitting: Cardiovascular Disease

## 2023-11-26 ENCOUNTER — Other Ambulatory Visit: Payer: Self-pay | Admitting: Family Medicine

## 2023-11-26 DIAGNOSIS — M5442 Lumbago with sciatica, left side: Secondary | ICD-10-CM

## 2023-11-26 DIAGNOSIS — G8929 Other chronic pain: Secondary | ICD-10-CM

## 2023-12-04 ENCOUNTER — Other Ambulatory Visit: Payer: Self-pay | Admitting: Cardiovascular Disease

## 2023-12-16 ENCOUNTER — Ambulatory Visit
Admission: RE | Admit: 2023-12-16 | Discharge: 2023-12-16 | Disposition: A | Discharge status: Home / Self Care | Source: Ambulatory Visit | Attending: Family Medicine | Admitting: Family Medicine

## 2023-12-16 DIAGNOSIS — M5442 Lumbago with sciatica, left side: Secondary | ICD-10-CM

## 2023-12-20 ENCOUNTER — Other Ambulatory Visit: Payer: Self-pay | Admitting: Cardiovascular Disease

## 2023-12-22 ENCOUNTER — Other Ambulatory Visit: Payer: Self-pay | Admitting: Cardiovascular Disease

## 2023-12-23 ENCOUNTER — Other Ambulatory Visit (HOSPITAL_COMMUNITY): Payer: Self-pay

## 2023-12-23 ENCOUNTER — Other Ambulatory Visit: Payer: Self-pay

## 2023-12-23 MED ORDER — ICOSAPENT ETHYL 1 G PO CAPS
2.0000 g | ORAL_CAPSULE | Freq: Two times a day (BID) | ORAL | 1 refills | Status: DC
  Filled 2023-12-23: qty 120, 30d supply, fill #0

## 2024-01-10 ENCOUNTER — Telehealth: Payer: Self-pay | Admitting: Pharmacy Technician

## 2024-01-10 ENCOUNTER — Other Ambulatory Visit (HOSPITAL_COMMUNITY): Payer: Self-pay

## 2024-01-10 NOTE — Telephone Encounter (Signed)
 Pharmacy Patient Advocate Encounter   Received notification from CoverMyMeds that prior authorization for Icosapent  1GM is required/requested.   Insurance verification completed.   The patient is insured through [facility] .   Per test claim: PA required; PA submitted to above mentioned insurance via CoverMyMeds Key/confirmation #/EOC [id number] Status is pending

## 2024-01-10 NOTE — Telephone Encounter (Signed)
 Pharmacy Patient Advocate Encounter  Received notification from [facility] that Prior Authorization for Icosapent  has been APPROVED from 01/10/24 to 06/07/2038  Not able to test claim:  PA #/Case ID/Reference #: [id number]

## 2024-02-02 ENCOUNTER — Encounter: Payer: Self-pay | Admitting: Internal Medicine

## 2024-02-02 ENCOUNTER — Ambulatory Visit: Attending: Internal Medicine | Admitting: Internal Medicine

## 2024-02-02 VITALS — BP 115/60 | HR 59 | Ht 69.0 in | Wt 208.4 lb

## 2024-02-02 DIAGNOSIS — I2111 ST elevation (STEMI) myocardial infarction involving right coronary artery: Secondary | ICD-10-CM

## 2024-02-02 DIAGNOSIS — I2510 Atherosclerotic heart disease of native coronary artery without angina pectoris: Secondary | ICD-10-CM | POA: Diagnosis not present

## 2024-02-02 DIAGNOSIS — Z79899 Other long term (current) drug therapy: Secondary | ICD-10-CM

## 2024-02-02 DIAGNOSIS — E782 Mixed hyperlipidemia: Secondary | ICD-10-CM

## 2024-02-02 DIAGNOSIS — I252 Old myocardial infarction: Secondary | ICD-10-CM

## 2024-02-02 DIAGNOSIS — E785 Hyperlipidemia, unspecified: Secondary | ICD-10-CM

## 2024-02-02 MED ORDER — CLOPIDOGREL BISULFATE 75 MG PO TABS: 75.0000 mg | ORAL_TABLET | Freq: Every day | ORAL | 3 refills | Status: AC

## 2024-02-02 MED ORDER — ATORVASTATIN CALCIUM 20 MG PO TABS: 20.0000 mg | ORAL_TABLET | Freq: Every day | ORAL | 3 refills | Status: AC

## 2024-02-02 MED ORDER — NITROGLYCERIN 0.4 MG SL SUBL: 0.4000 mg | SUBLINGUAL_TABLET | SUBLINGUAL | 4 refills | Status: AC | PRN

## 2024-02-02 NOTE — Progress Notes (Unsigned)
  Cardiology Office Note:  .   Date:  02/02/2024  ID:  Eric Anderson, DOB 1961-03-31, MRN [id number] PCP: Valora Agent, MD  [facility] Providers Cardiologist:  Debby Sor, MD (Inactive) { Click to update primary MD,subspecialty MD or APP then REFRESH:1}    History of Present Illness: Eric Anderson is a 63 y.o. male with history of coronary artery disease status post PCI to the RCA in the setting of inferolateral STEMI in 07/2021 followed by staged PCI to the LAD, hyperlipidemia, and BPH, who presents for follow-up of coronary artery disease.  He was previously followed in [facility] by Dr. Sor.  He was last seen by Dr. Sor in 04/2023, at which time he was feeling well.  Evolocumab  was initiated in place of ezetimibe  given significantly elevated LP(a).  Switch to clopidogrel .  Dependent edema for quite a while.  No chest pain.  Biggest issue is low back pain.  Had some back shots ~4 years ago.  Not sure if helped but thinking.  With MI, felt very tired and short of breath.  No significant chest pain.  Mild DOE in yard.  Stable.  No bleeding.  Doesn't check BP regularly.  ROS: See HPI  Studies Reviewed: SABRA   EKG Interpretation Date/Time:  Wednesday February 02 2024 16:43:46 EDT Ventricular Rate:  59 PR Interval:  142 QRS Duration:  84 QT Interval:  414 QTC Calculation: 409 R Axis:   -28  Text Interpretation: Sinus bradycardia Inferior infarct (cited on or before 12-Jul-2021) Abnormal ECG When compared with ECG of 23-Apr-2023 09:41, No significant change was found Confirmed by Avari Nevares, Lonni [phone]) on 02/02/2024 4:50:00 PM    *** Risk Assessment/Calculations:   {Does this patient have ATRIAL FIBRILLATION?:[phone]}         Physical Exam:   VS:  BP 115/60 (BP Location: Left Arm, Patient Position: Sitting, Cuff Size: Normal)   Pulse (!) 59   Ht 5' 9 (1.753 m)   Wt 208 lb 6.4 oz (94.5 kg)   SpO2 98%   BMI 30.78 kg/m    Wt Readings from Last 3 Encounters:   02/02/24 208 lb 6.4 oz (94.5 kg)  04/23/23 205 lb 6.4 oz (93.2 kg)  11/20/22 207 lb 9.6 oz (94.2 kg)    General:  NAD. Neck: No JVD or HJR. Lungs: Clear to auscultation bilaterally without wheezes or crackles. Heart: Regular rate and rhythm without murmurs, rubs, or gallops. Abdomen: Soft, nontender, nondistended. Extremities: No lower extremity edema.  ASSESSMENT AND PLAN: .    ***    {Are you ordering a CV Procedure (e.g. stress test, cath, DCCV, TEE, etc)?   Press F2        :[id number]}  Dispo: ***  Signed, Lonni Hanson, MD

## 2024-02-02 NOTE — Patient Instructions (Signed)
 Medication Instructions:  Your physician recommends the following medication changes.  STOP TAKING: Aspirin  Brillinta - finish your pill supply at home   START TAKING: Plavix  75 mg daily (once you finish the Brillinta)   DECREASE: Lipitor  20 mg daily  *If you need a refill on your cardiac medications before your next appointment, please call your pharmacy*  Lab Work: Your provider would like for you to return in 3 months to have the following labs drawn: Lipid panel.   Please go to [facility] [address] ([facility] [address], [place] [address] You do not need an appointment.  They are open from 8 am- 4:30 pm.  Lunch from 1:00 pm- 2:00 pm You DO need to be fasting.  You may also go to one of the following [facility]:  [address] [place], [place] [address] Phone: [phone] Lab hours: Mon-Fri 8 am- 5 pm    Lunch 12 pm- 1 pm  [address] [place],  [place]  [address]  [place]  Phone: [phone] Lab hours: 7 am- 4 pm Lunch 12 pm-1 pm   [address] [place],  [place]  [address]  [place]  Phone: [phone] Lab hours: Mon-Fri 8 am- 5 pm    Lunch 12 pm- 1 pm  If you have labs (blood work) drawn today and your tests are completely normal, you will receive your results only by: MyChart Message (if you have MyChart) OR A paper copy in the mail If you have any lab test that is abnormal or we need to change your treatment, we will call you to review the results.  Follow-Up: At [facility], you and your health needs are our priority.  As part of our continuing mission to provide you with exceptional heart care, our providers are all part of one team.  This team includes your primary Cardiologist (physician) and Advanced Practice Providers or APPs (Physician Assistants and Nurse Practitioners) who all work together to provide you with the care you need, when you need it.  Your next appointment:   1 year(s)  Provider:   You may see Lonni Hanson,  MD

## 2024-02-04 ENCOUNTER — Encounter: Payer: Self-pay | Admitting: Internal Medicine

## 2024-05-01 ENCOUNTER — Other Ambulatory Visit: Payer: Self-pay | Admitting: Internal Medicine

## 2024-05-08 MED ORDER — ICOSAPENT ETHYL 1 G PO CAPS: 2.0000 g | ORAL_CAPSULE | Freq: Two times a day (BID) | ORAL | 2 refills | Status: AC

## 2024-05-30 ENCOUNTER — Other Ambulatory Visit: Payer: Self-pay | Admitting: Family
